# Patient Record
Sex: Female | Born: 1950
Health system: Southern US, Community
[De-identification: ages and names within clinical notes are randomized; demographics above are authoritative.]

## PROBLEM LIST (undated history)

## (undated) DIAGNOSIS — M1990 Unspecified osteoarthritis, unspecified site: Secondary | ICD-10-CM

## (undated) DIAGNOSIS — E079 Disorder of thyroid, unspecified: Secondary | ICD-10-CM

## (undated) DIAGNOSIS — E039 Hypothyroidism, unspecified: Secondary | ICD-10-CM

## (undated) DIAGNOSIS — E119 Type 2 diabetes mellitus without complications: Secondary | ICD-10-CM

## (undated) DIAGNOSIS — E7800 Pure hypercholesterolemia, unspecified: Secondary | ICD-10-CM

## (undated) DIAGNOSIS — I10 Essential (primary) hypertension: Secondary | ICD-10-CM

## (undated) DIAGNOSIS — C801 Malignant (primary) neoplasm, unspecified: Secondary | ICD-10-CM

## (undated) DIAGNOSIS — E785 Hyperlipidemia, unspecified: Secondary | ICD-10-CM

## (undated) DIAGNOSIS — R000 Tachycardia, unspecified: Secondary | ICD-10-CM

## (undated) DIAGNOSIS — R002 Palpitations: Secondary | ICD-10-CM

## (undated) DIAGNOSIS — I4510 Unspecified right bundle-branch block: Secondary | ICD-10-CM

## (undated) DIAGNOSIS — C50919 Malignant neoplasm of unspecified site of unspecified female breast: Secondary | ICD-10-CM

## (undated) DIAGNOSIS — T8859XA Other complications of anesthesia, initial encounter: Secondary | ICD-10-CM

## (undated) HISTORY — DX: Unspecified osteoarthritis, unspecified site: M19.90

## (undated) HISTORY — DX: Unspecified right bundle-branch block: I45.10

## (undated) HISTORY — PX: BREAST LUMPECTOMY: SHX2

## (undated) HISTORY — DX: Essential (primary) hypertension: I10

## (undated) HISTORY — PX: OTHER SURGICAL HISTORY: SHX169

## (undated) HISTORY — DX: Disorder of thyroid, unspecified: E07.9

## (undated) HISTORY — DX: Malignant (primary) neoplasm, unspecified: C80.1

## (undated) HISTORY — DX: Hyperlipidemia, unspecified: E78.5

## (undated) HISTORY — DX: Tachycardia, unspecified: R00.0

## (undated) HISTORY — DX: Palpitations: R00.2

---

## 1990-08-09 HISTORY — PX: BACK SURGERY: SHX140

## 1998-09-26 ENCOUNTER — Other Ambulatory Visit: Admission: RE | Admit: 1998-09-26 | Discharge: 1998-09-26 | Payer: Self-pay | Admitting: Family Medicine

## 1999-02-25 ENCOUNTER — Other Ambulatory Visit: Admission: RE | Admit: 1999-02-25 | Discharge: 1999-02-25 | Payer: Self-pay | Admitting: Urology

## 1999-02-26 ENCOUNTER — Other Ambulatory Visit: Admission: RE | Admit: 1999-02-26 | Discharge: 1999-02-26 | Payer: Self-pay | Admitting: Urology

## 1999-03-25 ENCOUNTER — Other Ambulatory Visit: Admission: RE | Admit: 1999-03-25 | Discharge: 1999-03-25 | Payer: Self-pay | Admitting: Urology

## 1999-04-01 ENCOUNTER — Other Ambulatory Visit: Admission: RE | Admit: 1999-04-01 | Discharge: 1999-04-01 | Payer: Self-pay | Admitting: Urology

## 2000-08-09 DIAGNOSIS — C801 Malignant (primary) neoplasm, unspecified: Secondary | ICD-10-CM

## 2000-08-09 DIAGNOSIS — C50919 Malignant neoplasm of unspecified site of unspecified female breast: Secondary | ICD-10-CM

## 2000-08-09 HISTORY — PX: BREAST LUMPECTOMY: SHX2

## 2000-08-09 HISTORY — DX: Malignant (primary) neoplasm, unspecified: C80.1

## 2000-08-09 HISTORY — DX: Malignant neoplasm of unspecified site of unspecified female breast: C50.919

## 2000-08-23 ENCOUNTER — Encounter: Admission: RE | Admit: 2000-08-23 | Discharge: 2000-08-23 | Payer: Self-pay | Admitting: Family Medicine

## 2000-08-23 ENCOUNTER — Encounter: Payer: Self-pay | Admitting: Family Medicine

## 2000-12-13 ENCOUNTER — Other Ambulatory Visit: Admission: RE | Admit: 2000-12-13 | Discharge: 2000-12-13 | Payer: Self-pay | Admitting: Radiology

## 2000-12-21 ENCOUNTER — Ambulatory Visit (HOSPITAL_BASED_OUTPATIENT_CLINIC_OR_DEPARTMENT_OTHER): Admission: RE | Admit: 2000-12-21 | Discharge: 2000-12-21 | Payer: Self-pay | Admitting: Surgery

## 2000-12-21 ENCOUNTER — Encounter (INDEPENDENT_AMBULATORY_CARE_PROVIDER_SITE_OTHER): Payer: Self-pay | Admitting: Specialist

## 2000-12-29 ENCOUNTER — Ambulatory Visit: Admission: RE | Admit: 2000-12-29 | Discharge: 2001-03-29 | Payer: Self-pay | Admitting: Surgery

## 2001-01-16 ENCOUNTER — Encounter: Payer: Self-pay | Admitting: *Deleted

## 2001-01-16 ENCOUNTER — Ambulatory Visit (HOSPITAL_COMMUNITY): Admission: RE | Admit: 2001-01-16 | Discharge: 2001-01-16 | Payer: Self-pay | Admitting: *Deleted

## 2001-01-19 ENCOUNTER — Ambulatory Visit (HOSPITAL_BASED_OUTPATIENT_CLINIC_OR_DEPARTMENT_OTHER): Admission: RE | Admit: 2001-01-19 | Discharge: 2001-01-19 | Payer: Self-pay | Admitting: Surgery

## 2001-01-19 ENCOUNTER — Encounter: Payer: Self-pay | Admitting: Surgery

## 2001-04-19 ENCOUNTER — Ambulatory Visit: Admission: RE | Admit: 2001-04-19 | Discharge: 2001-07-18 | Payer: Self-pay | Admitting: Radiation Oncology

## 2001-04-26 ENCOUNTER — Ambulatory Visit (HOSPITAL_BASED_OUTPATIENT_CLINIC_OR_DEPARTMENT_OTHER): Admission: RE | Admit: 2001-04-26 | Discharge: 2001-04-26 | Payer: Self-pay | Admitting: Surgery

## 2002-05-17 ENCOUNTER — Encounter: Admission: RE | Admit: 2002-05-17 | Discharge: 2002-08-15 | Payer: Self-pay

## 2002-12-25 ENCOUNTER — Encounter: Payer: Self-pay | Admitting: Family Medicine

## 2002-12-25 ENCOUNTER — Encounter: Admission: RE | Admit: 2002-12-25 | Discharge: 2002-12-25 | Payer: Self-pay | Admitting: Family Medicine

## 2003-03-11 ENCOUNTER — Ambulatory Visit (HOSPITAL_COMMUNITY): Admission: RE | Admit: 2003-03-11 | Discharge: 2003-03-11 | Payer: Self-pay | Admitting: Gastroenterology

## 2003-05-09 ENCOUNTER — Encounter: Admission: RE | Admit: 2003-05-09 | Discharge: 2003-05-09 | Payer: Self-pay | Admitting: Family Medicine

## 2003-05-09 ENCOUNTER — Encounter: Payer: Self-pay | Admitting: Family Medicine

## 2003-09-11 ENCOUNTER — Encounter: Admission: RE | Admit: 2003-09-11 | Discharge: 2003-09-11 | Payer: Self-pay | Admitting: Gastroenterology

## 2004-01-30 ENCOUNTER — Encounter: Admission: RE | Admit: 2004-01-30 | Discharge: 2004-01-30 | Payer: Self-pay | Admitting: Family Medicine

## 2004-04-03 ENCOUNTER — Ambulatory Visit (HOSPITAL_COMMUNITY): Admission: RE | Admit: 2004-04-03 | Discharge: 2004-04-03 | Payer: Self-pay | Admitting: Obstetrics and Gynecology

## 2004-04-03 ENCOUNTER — Encounter (INDEPENDENT_AMBULATORY_CARE_PROVIDER_SITE_OTHER): Payer: Self-pay | Admitting: *Deleted

## 2004-06-14 ENCOUNTER — Ambulatory Visit: Payer: Self-pay | Admitting: Oncology

## 2004-12-14 ENCOUNTER — Ambulatory Visit: Payer: Self-pay | Admitting: Oncology

## 2005-02-25 ENCOUNTER — Ambulatory Visit (HOSPITAL_COMMUNITY): Admission: RE | Admit: 2005-02-25 | Discharge: 2005-02-25 | Payer: Self-pay | Admitting: Gastroenterology

## 2005-02-25 ENCOUNTER — Encounter (INDEPENDENT_AMBULATORY_CARE_PROVIDER_SITE_OTHER): Payer: Self-pay | Admitting: *Deleted

## 2005-06-11 ENCOUNTER — Emergency Department (HOSPITAL_COMMUNITY): Admission: EM | Admit: 2005-06-11 | Discharge: 2005-06-11 | Payer: Self-pay | Admitting: Emergency Medicine

## 2005-06-14 ENCOUNTER — Ambulatory Visit: Payer: Self-pay | Admitting: Oncology

## 2006-03-15 ENCOUNTER — Ambulatory Visit: Payer: Self-pay | Admitting: Oncology

## 2006-06-10 ENCOUNTER — Ambulatory Visit: Payer: Self-pay | Admitting: Oncology

## 2006-08-06 ENCOUNTER — Ambulatory Visit: Payer: Self-pay | Admitting: Oncology

## 2006-08-11 LAB — FOLLICLE STIMULATING HORMONE: FSH: 22.1 m[IU]/mL

## 2006-08-19 LAB — ESTRADIOL, ULTRA SENS: Estradiol, Ultra Sensitive: 27 pg/mL

## 2006-10-05 ENCOUNTER — Encounter: Admission: RE | Admit: 2006-10-05 | Discharge: 2006-10-05 | Payer: Self-pay | Admitting: Family Medicine

## 2006-11-03 ENCOUNTER — Ambulatory Visit: Payer: Self-pay | Admitting: Oncology

## 2007-02-17 ENCOUNTER — Ambulatory Visit: Payer: Self-pay | Admitting: Oncology

## 2007-02-21 LAB — FOLLICLE STIMULATING HORMONE: FSH: 48.2 m[IU]/mL

## 2007-03-02 LAB — ESTRADIOL, ULTRA SENS: Estradiol, Ultra Sensitive: 6 pg/mL

## 2007-05-09 ENCOUNTER — Ambulatory Visit: Payer: Self-pay | Admitting: Oncology

## 2007-11-15 ENCOUNTER — Ambulatory Visit: Payer: Self-pay | Admitting: Oncology

## 2007-12-12 ENCOUNTER — Ambulatory Visit (HOSPITAL_COMMUNITY): Admission: RE | Admit: 2007-12-12 | Discharge: 2007-12-12 | Payer: Self-pay | Admitting: Cardiology

## 2008-08-15 ENCOUNTER — Ambulatory Visit: Payer: Self-pay | Admitting: Oncology

## 2009-05-15 ENCOUNTER — Ambulatory Visit: Payer: Self-pay | Admitting: Oncology

## 2010-05-15 ENCOUNTER — Ambulatory Visit: Payer: Self-pay | Admitting: Oncology

## 2010-07-24 ENCOUNTER — Ambulatory Visit: Payer: Self-pay | Admitting: Cardiology

## 2010-08-29 ENCOUNTER — Encounter: Payer: Self-pay | Admitting: Gastroenterology

## 2010-12-25 NOTE — Op Note (Signed)
NAME:  Christina Jensen, Christina Jensen NO.:  [id number]   MEDICAL RECORD NO.:  [id number]                   PATIENT TYPE:  AMB   LOCATION:  SDC                                  FACILITY:  [facility]   PHYSICIAN:  Laqueta Linden, M.D.                 DATE OF BIRTH:  05/04/1951   DATE OF PROCEDURE:  04/03/2004  DATE OF DISCHARGE:                                 OPERATIVE REPORT   PREOPERATIVE DIAGNOSES:  1. Large endometrial polyp.  2. Postmenopausal bleeding.  3. Breast cancer on tamoxifen.   POSTOPERATIVE DIAGNOSES:  1. Large endometrial polyp.  2. Postmenopausal bleeding.  3. Breast cancer on tamoxifen.   PROCEDURE:  Hysteroscopic resection with curettage.   SURGEON:  Laqueta Linden, M.D.   ANESTHESIA:  MAC sedation with paracervical block.   ESTIMATED BLOOD LOSS:  Less than 20 mL.   SORBITOL:  Less than 50 mL.   SPECIMENS:  1. Polyp.  2. Curettings.   COMPLICATIONS:  None.   INDICATIONS FOR PROCEDURE:  Christina Jensen is a 60 year old menopausal female  with a history of breast cancer status post lumpectomy, chemotherapy and  radiation who is now on tamoxifen therapy. She presented with postmenopausal  bleeding.  Evaluation included an office biopsy which was benign. She also  underwent ultrasound with sonohysterogram which was notable for a 2.3 x 2.1  cm fundal endometrial polyp most likely related to her tamoxifen use. She is  therefore to undergo outpatient resection of the polyp. She is therefore to  undergo outpatient resection of the polyp. She has seen the informed consent  film and voiced her understanding and acceptance of all risks, benefits,  alternatives and complications including infection, bleeding, uterine  perforation and possibility of recurrent polyps particularly due to her  tamoxifen use as well as other unnamed risks and she agrees to proceed. Full  consent has been given.   DESCRIPTION OF PROCEDURE:  The patient was taken to the  operating room and  after proper identification and consents were ascertained, she was placed on  the operating table in supine position. MAC sedation was accomplished and  she was placed in the [place] stirrups. Perineum and vagina were prepped and  draped in a routine sterile fashion and a Foley catheter was placed which  was removed at the conclusion of the procedure. Bimanual examination  confirmed a retroverted normal size uterus. A speculum was placed in the  vagina and the patient became quite agitated. She received some additional  IV anesthesia and eventually was able to tolerate the procedure. A  paracervical block utilizing 10 mL of 1% plain Xylocaine was then placed  circumferentially. The cervix was grasped with a single tooth tenaculum. The  internal os was gently dilated to a #31 Pratt dilator. The resectoscope was  then inserted under direct vision using continuous sorbitol infusion and  video. The endocervical canal was free of  lesions.  The endometrial cavity  was completely filled by a large polyp which appeared to have a broad fundal  posterior base. The right tubal ostia was seen. The left tubal ostia was not  visualized due to the bulk of the polyp. The resectoscope was placed on  routine settings and the polyp was then resected with multiple passes of the  loop with it being necessary to evacuate each piece individually such that  visualization of the remaining polyp could be obtained.  Eventually the  entire polyp was resected down to its base which actually appeared to be  more in the left cornual region.  The entire polyp appeared to be resected.  Thee was no active bleeding at the polyp base. The remainder of the  endometrial cavity appeared somewhat vascular and perhaps thickened. For  this reason, sharp curettage was performed with a minimal amount of  additional tissue obtained which was sent as a separate specimen. There was  no active bleeding from the  endometrial cavity. Photographs of pre and post  resection findings had been taken. The instruments were removed, there was a  slight amount of bleeding from the tenaculum site which responded to local  pressure. There was a slight amount of bleeding from the os but no active  bleeding noted.  Estimated blood loss was less than 20 mL, sorbitol net  intake less than 50 mL, complications none.   The patient was stable on transfer to the PACU. She received 30 mg of  Toradol IV, 30 mg IM intraoperatively.  She will be observed and discharged  per anesthesia protocol. She is to followup in the office in 2-3 weeks time  and to call sooner for excessive pain, fever, bleeding or other concerns.  She was given routine written and verbal discharge instructions. She is take  all her routine medications and to take Advil or Aleve as needed for  cramping.                                               Laqueta Linden, M.D.    LKS/MEDQ  D:  04/03/2004  T:  04/03/2004  Job:  [id number]   cc:   Christina Fendt, FNP   Leighton Roach. Truett Perna, M.D.  [address]  [place]  [place] [address]  Fax: [phone]

## 2011-06-07 ENCOUNTER — Encounter (HOSPITAL_BASED_OUTPATIENT_CLINIC_OR_DEPARTMENT_OTHER): Payer: BC Managed Care – PPO | Admitting: Oncology

## 2011-06-07 DIAGNOSIS — C50019 Malignant neoplasm of nipple and areola, unspecified female breast: Secondary | ICD-10-CM

## 2011-11-04 ENCOUNTER — Encounter: Payer: Self-pay | Admitting: *Deleted

## 2011-11-04 DIAGNOSIS — I4510 Unspecified right bundle-branch block: Secondary | ICD-10-CM | POA: Insufficient documentation

## 2011-11-04 DIAGNOSIS — R002 Palpitations: Secondary | ICD-10-CM | POA: Insufficient documentation

## 2011-11-04 DIAGNOSIS — C801 Malignant (primary) neoplasm, unspecified: Secondary | ICD-10-CM | POA: Insufficient documentation

## 2011-11-04 DIAGNOSIS — M1990 Unspecified osteoarthritis, unspecified site: Secondary | ICD-10-CM | POA: Insufficient documentation

## 2011-11-04 DIAGNOSIS — R000 Tachycardia, unspecified: Secondary | ICD-10-CM | POA: Insufficient documentation

## 2012-04-04 ENCOUNTER — Encounter: Payer: Self-pay | Admitting: *Deleted

## 2013-04-21 ENCOUNTER — Emergency Department (HOSPITAL_COMMUNITY): Payer: BC Managed Care – PPO

## 2013-04-21 ENCOUNTER — Emergency Department (HOSPITAL_COMMUNITY)
Admission: EM | Admit: 2013-04-21 | Discharge: 2013-04-21 | Disposition: A | Discharge status: Home / Self Care | Payer: BC Managed Care – PPO | Attending: Emergency Medicine | Admitting: Emergency Medicine

## 2013-04-21 ENCOUNTER — Encounter (HOSPITAL_COMMUNITY): Payer: Self-pay

## 2013-04-21 DIAGNOSIS — M1990 Unspecified osteoarthritis, unspecified site: Secondary | ICD-10-CM | POA: Insufficient documentation

## 2013-04-21 DIAGNOSIS — Z7982 Long term (current) use of aspirin: Secondary | ICD-10-CM | POA: Insufficient documentation

## 2013-04-21 DIAGNOSIS — R109 Unspecified abdominal pain: Secondary | ICD-10-CM

## 2013-04-21 DIAGNOSIS — Z853 Personal history of malignant neoplasm of breast: Secondary | ICD-10-CM | POA: Insufficient documentation

## 2013-04-21 DIAGNOSIS — E119 Type 2 diabetes mellitus without complications: Secondary | ICD-10-CM | POA: Insufficient documentation

## 2013-04-21 DIAGNOSIS — Z79899 Other long term (current) drug therapy: Secondary | ICD-10-CM | POA: Insufficient documentation

## 2013-04-21 DIAGNOSIS — R112 Nausea with vomiting, unspecified: Secondary | ICD-10-CM | POA: Insufficient documentation

## 2013-04-21 DIAGNOSIS — E039 Hypothyroidism, unspecified: Secondary | ICD-10-CM | POA: Insufficient documentation

## 2013-04-21 DIAGNOSIS — M549 Dorsalgia, unspecified: Secondary | ICD-10-CM | POA: Insufficient documentation

## 2013-04-21 DIAGNOSIS — R1011 Right upper quadrant pain: Secondary | ICD-10-CM | POA: Insufficient documentation

## 2013-04-21 DIAGNOSIS — J45909 Unspecified asthma, uncomplicated: Secondary | ICD-10-CM | POA: Insufficient documentation

## 2013-04-21 DIAGNOSIS — I10 Essential (primary) hypertension: Secondary | ICD-10-CM | POA: Insufficient documentation

## 2013-04-21 DIAGNOSIS — Z87891 Personal history of nicotine dependence: Secondary | ICD-10-CM | POA: Insufficient documentation

## 2013-04-21 LAB — CBC WITH DIFFERENTIAL/PLATELET
Basophils Absolute: 0.1 10*3/uL (ref 0.0–0.1)
Basophils Relative: 1 % (ref 0–1)
Eosinophils Absolute: 0.1 10*3/uL (ref 0.0–0.7)
Eosinophils Relative: 1 % (ref 0–5)
HCT: 40.4 % (ref 36.0–46.0)
Hemoglobin: 14.3 g/dL (ref 12.0–15.0)
Lymphocytes Relative: 18 % (ref 12–46)
Lymphs Abs: 1.5 10*3/uL (ref 0.7–4.0)
MCH: 32.2 pg (ref 26.0–34.0)
MCHC: 35.4 g/dL (ref 30.0–36.0)
MCV: 91 fL (ref 78.0–100.0)
Monocytes Absolute: 0.3 10*3/uL (ref 0.1–1.0)
Monocytes Relative: 4 % (ref 3–12)
Neutro Abs: 6.1 10*3/uL (ref 1.7–7.7)
Neutrophils Relative %: 76 % (ref 43–77)
Platelets: 298 10*3/uL (ref 150–400)
RBC: 4.44 MIL/uL (ref 3.87–5.11)
RDW: 12.5 % (ref 11.5–15.5)
WBC: 8.1 10*3/uL (ref 4.0–10.5)

## 2013-04-21 LAB — URINALYSIS, ROUTINE W REFLEX MICROSCOPIC
Bilirubin Urine: NEGATIVE
Glucose, UA: 100 mg/dL — AB
Ketones, ur: NEGATIVE mg/dL
Nitrite: NEGATIVE
Protein, ur: NEGATIVE mg/dL
Specific Gravity, Urine: 1.017 (ref 1.005–1.030)
Urobilinogen, UA: 0.2 mg/dL (ref 0.0–1.0)
pH: 7.5 (ref 5.0–8.0)

## 2013-04-21 LAB — HEPATIC FUNCTION PANEL
ALT: 46 U/L — ABNORMAL HIGH (ref 0–35)
AST: 42 U/L — ABNORMAL HIGH (ref 0–37)
Albumin: 4.4 g/dL (ref 3.5–5.2)
Alkaline Phosphatase: 61 U/L (ref 39–117)
Bilirubin, Direct: 0.1 mg/dL (ref 0.0–0.3)
Indirect Bilirubin: 0.4 mg/dL (ref 0.3–0.9)
Total Bilirubin: 0.5 mg/dL (ref 0.3–1.2)
Total Protein: 7.4 g/dL (ref 6.0–8.3)

## 2013-04-21 LAB — BASIC METABOLIC PANEL
BUN: 15 mg/dL (ref 6–23)
CO2: 25 mEq/L (ref 19–32)
Calcium: 10.5 mg/dL (ref 8.4–10.5)
Chloride: 99 mEq/L (ref 96–112)
Creatinine, Ser: 0.93 mg/dL (ref 0.50–1.10)
GFR calc Af Amer: 75 mL/min — ABNORMAL LOW (ref >90)
GFR calc non Af Amer: 65 mL/min — ABNORMAL LOW (ref >90)
Glucose, Bld: 185 mg/dL — ABNORMAL HIGH (ref 70–99)
Potassium: 3.2 mEq/L — ABNORMAL LOW (ref 3.5–5.1)
Sodium: 137 mEq/L (ref 135–145)

## 2013-04-21 LAB — URINE MICROSCOPIC-ADD ON

## 2013-04-21 LAB — LIPASE, BLOOD: Lipase: 45 U/L (ref 11–59)

## 2013-04-21 LAB — TROPONIN I: Troponin I: <0.3 ng/mL (ref <0.30)

## 2013-04-21 MED ORDER — IOHEXOL 300 MG/ML  SOLN
50.0000 mL | Freq: Once | INTRAMUSCULAR | Status: AC | PRN
  Administered 2013-04-21: 50 mL via ORAL

## 2013-04-21 MED ORDER — ONDANSETRON HCL 4 MG/2ML IJ SOLN
4.0000 mg | Freq: Once | INTRAMUSCULAR | Status: AC
  Administered 2013-04-21: 4 mg via INTRAVENOUS

## 2013-04-21 MED ORDER — IOHEXOL 300 MG/ML  SOLN
100.0000 mL | Freq: Once | INTRAMUSCULAR | Status: AC | PRN
  Administered 2013-04-21: 100 mL via INTRAVENOUS

## 2013-04-21 MED ORDER — PANTOPRAZOLE SODIUM 40 MG IV SOLR
40.0000 mg | Freq: Once | INTRAVENOUS | Status: AC
  Administered 2013-04-21: 40 mg via INTRAVENOUS
  Filled 2013-04-21: qty 40

## 2013-04-21 MED ORDER — MORPHINE SULFATE 4 MG/ML IJ SOLN
4.0000 mg | Freq: Once | INTRAMUSCULAR | Status: AC
  Administered 2013-04-21: 4 mg via INTRAVENOUS

## 2013-04-21 MED ORDER — ONDANSETRON 4 MG PO TBDP: ORAL_TABLET | ORAL | Status: DC

## 2013-04-21 MED ORDER — OXYCODONE-ACETAMINOPHEN 5-325 MG PO TABS: 1.0000 | ORAL_TABLET | ORAL | Status: DC | PRN

## 2013-04-21 MED ORDER — HYDROMORPHONE HCL PF 1 MG/ML IJ SOLN
1.0000 mg | Freq: Once | INTRAMUSCULAR | Status: AC
  Administered 2013-04-21: 1 mg via INTRAVENOUS
  Filled 2013-04-21: qty 1

## 2013-04-21 NOTE — ED Notes (Signed)
EKG given to EDP, Ranae Palms, MD.

## 2013-04-21 NOTE — ED Provider Notes (Signed)
Patient received in signout from Dr. Ranae Palms.  Patient had improvement of abdominal pain with Dilaudid. Reexamination reveals a soft abdomen without tenderness, rebound, or guarding. Patient was given a by mouth challenge. Patient will be discharged home with close followup.  Shon Baton, MD 04/21/13 [phone]

## 2013-04-21 NOTE — ED Notes (Signed)
Pt states abdominal pain x 1 week.  Pt states abdomen hurts to RLQ but has also had back pain.  Vomiting.  No diarrhea.  No fever.

## 2013-04-21 NOTE — ED Provider Notes (Signed)
CSN: [id number]     Arrival date & time 04/21/13  0321 History   First MD Initiated Contact with Patient 04/21/13 0404     Chief Complaint  Patient presents with  . Abdominal Pain   (Consider location/radiation/quality/duration/timing/severity/associated sxs/prior Treatment) HPI Patient presents with one week of right upper quadrant pain radiating to her right flank. Pain is steadily been getting worse. Patient states she had nausea and multiple episodes of vomiting tonight. Vomiting is nonbilious and nonbloody. She's had no diarrhea or constipation. She has no fevers or chills. Patient's meds in the past by gastroenterology and treated for gastrointestinal reflux disease. Patient still has her gallbladder and appendix. She denies dysuria or any change in her urinary frequency. Patient denies chest pain or shortness of breath. Patient has lower extremity swelling or pain. Past Medical History  Diagnosis Date  . Hyperlipidemia   . Hypertension   . Diabetes mellitus   . Cancer 2002    breast,lumpectomy  . RBBB     chronic  . Tachycardia     H/O  . Palpitations     chronic  . Asthma   . Thyroid disease     hypo  . Osteoarthritis    Past Surgical History  Procedure Laterality Date  . Childbirth      x1  . Back surgery  1992  . Cataracts      both eyes  . Breast lumpectomy Right 2002   Family History  Problem Relation Age of Onset  . Heart disease Mother    History  Substance Use Topics  . Smoking status: Former Smoker    Quit date: 08/09/1978  . Smokeless tobacco: Not on file  . Alcohol Use: Not on file   OB History   Grav Para Term Preterm Abortions TAB SAB Ect Mult Living                 Review of Systems  Constitutional: Negative for fever and chills.  HENT: Negative for neck pain.   Respiratory: Negative for cough and shortness of breath.   Cardiovascular: Negative for chest pain, palpitations and leg swelling.  Gastrointestinal: Positive for nausea, vomiting  and abdominal pain. Negative for diarrhea, constipation and blood in stool.  Genitourinary: Positive for flank pain. Negative for dysuria, frequency and hematuria.  Musculoskeletal: Positive for back pain. Negative for myalgias and arthralgias.  Skin: Negative for rash and wound.  Neurological: Negative for dizziness, weakness, light-headedness, numbness and headaches.  All other systems reviewed and are negative.    Allergies  Simvastatin  Home Medications   Current Outpatient Rx  Name  Route  Sig  Dispense  Refill  . ALPRAZolam (XANAX) 1 MG tablet   Oral   Take 0.5 tablets by mouth at bedtime as needed for sleep.          Marland Kitchen aspirin 81 MG tablet   Oral   Take 81 mg by mouth every evening.          . B Complex-C (SUPER B COMPLEX PO)   Oral   Take 1 tablet by mouth every evening.         . cholecalciferol (VITAMIN D) 1000 UNITS tablet   Oral   Take 1,000 Units by mouth every evening.          . fenofibrate 160 MG tablet   Oral   Take 1 tablet by mouth every evening.          Marland Kitchen levothyroxine (SYNTHROID, LEVOTHROID) 100 MCG  tablet   Oral   Take 100 mcg by mouth daily before breakfast.         . losartan (COZAAR) 100 MG tablet   Oral   Take 1 tablet by mouth every morning.         . metFORMIN (GLUCOPHAGE) 1000 MG tablet   Oral   Take 1,000 mg by mouth every evening.          . Multiple Minerals-Vitamins (CALCIUM-MAGNESIUM-ZINC-D3) TABS   Oral   Take 1 tablet by mouth 3 (three) times daily.          . Multiple Vitamin (MULTIVITAMIN) tablet   Oral   Take 1 tablet by mouth every morning.          . Omega-3 Fatty Acids (FISH OIL) 1000 MG CAPS   Oral   Take 1 capsule by mouth 3 (three) times daily.          Marland Kitchen venlafaxine XR (EFFEXOR-XR) 150 MG 24 hr capsule   Oral   Take 150 mg by mouth every evening.          BP 150/81  Pulse 77  Temp(Src) 98.1 F (36.7 C) (Oral)  Resp 20  SpO2 100% Physical Exam  Nursing note and vitals  reviewed. Constitutional: She is oriented to person, place, and time. She appears well-developed and well-nourished. She appears distressed.  Patient in moderate distress  HENT:  Head: Normocephalic and atraumatic.  Mouth/Throat: Oropharynx is clear and moist.  Eyes: EOM are normal. Pupils are equal, round, and reactive to light.  Neck: Normal range of motion. Neck supple.  Cardiovascular: Normal rate and regular rhythm.   Pulmonary/Chest: Effort normal and breath sounds normal. No respiratory distress. She has no wheezes. She has no rales.  Abdominal: Soft. Bowel sounds are normal. She exhibits no distension and no mass. There is tenderness (has mild tenderness to palpation of her epigastrium). There is no rebound and no guarding.  Musculoskeletal: Normal range of motion. She exhibits no edema and no tenderness.  No flank tenderness to palpation.  Neurological: She is alert and oriented to person, place, and time.  Patient is alert and oriented x3 with clear, goal oriented speech. Patient has 5/5 motor in all extremities. Sensation is intact to light touch. Patient has a normal gait and walks without assistance.   Skin: Skin is warm and dry. No rash noted. No erythema.  Psychiatric: She has a normal mood and affect. Her behavior is normal.    ED Course  Procedures (including critical care time) Labs Review Labs Reviewed  URINALYSIS, ROUTINE W REFLEX MICROSCOPIC - Abnormal; Notable for the following:    APPearance TURBID (*)    Glucose, UA 100 (*)    Hgb urine dipstick TRACE (*)    Leukocytes, UA TRACE (*)    All other components within normal limits  URINE MICROSCOPIC-ADD ON  CBC WITH DIFFERENTIAL  BASIC METABOLIC PANEL  LIPASE, BLOOD  HEPATIC FUNCTION PANEL  TROPONIN I   Imaging Review No results found.  Date: 04/21/2013  Rate: 74  Rhythm: normal sinus rhythm  QRS Axis: normal  Intervals: PR prolonged  ST/T Wave abnormalities: nonspecific T wave changes  Conduction  Disutrbances:right bundle branch block  Narrative Interpretation:   Old EKG Reviewed: none available  Patient with previous history of right bundle branch block. MDM   Patient momentarily improvement with IV pain medication but she states the pain has returned to her right upper quadrant. Ultrasound and CT were negative for  acute findings. Bowel sounds remained stable in the emergency department. Patient offered admission for pain control. States she would rather have that his pain medication possibly go home with oral pain medication. We'll give strict discharge instructions to return for worsening pain, fevers, persistent vomiting or any concerns. Oncoming ED to reassess and disposition.  Loren Racer, MD 04/21/13 [phone]

## 2013-05-01 ENCOUNTER — Other Ambulatory Visit: Payer: Self-pay | Admitting: Gastroenterology

## 2013-05-01 DIAGNOSIS — R1011 Right upper quadrant pain: Secondary | ICD-10-CM

## 2013-05-01 DIAGNOSIS — R112 Nausea with vomiting, unspecified: Secondary | ICD-10-CM

## 2013-05-01 DIAGNOSIS — R1013 Epigastric pain: Secondary | ICD-10-CM

## 2013-05-15 ENCOUNTER — Encounter (HOSPITAL_COMMUNITY)
Admission: RE | Admit: 2013-05-15 | Discharge: 2013-05-15 | Disposition: A | Discharge status: Home / Self Care | Payer: BC Managed Care – PPO | Source: Ambulatory Visit | Attending: Gastroenterology | Admitting: Gastroenterology

## 2013-05-15 DIAGNOSIS — R112 Nausea with vomiting, unspecified: Secondary | ICD-10-CM

## 2013-05-15 DIAGNOSIS — R1011 Right upper quadrant pain: Secondary | ICD-10-CM

## 2013-05-15 DIAGNOSIS — R1013 Epigastric pain: Secondary | ICD-10-CM

## 2013-05-15 MED ORDER — SINCALIDE 5 MCG IJ SOLR
0.0200 ug/kg | Freq: Once | INTRAMUSCULAR | Status: AC
  Administered 2013-05-15: 1.55 ug via INTRAVENOUS

## 2013-05-15 MED ORDER — TECHNETIUM TC 99M MEBROFENIN IV KIT
5.0000 | PACK | Freq: Once | INTRAVENOUS | Status: AC | PRN
  Administered 2013-05-15: 5 via INTRAVENOUS

## 2013-05-15 MED ORDER — SINCALIDE 5 MCG IJ SOLR
INTRAMUSCULAR | Status: AC
  Filled 2013-05-15: qty 5

## 2017-02-04 DIAGNOSIS — M65341 Trigger finger, right ring finger: Secondary | ICD-10-CM | POA: Diagnosis not present

## 2017-03-01 DIAGNOSIS — I10 Essential (primary) hypertension: Secondary | ICD-10-CM | POA: Diagnosis not present

## 2017-03-01 DIAGNOSIS — E119 Type 2 diabetes mellitus without complications: Secondary | ICD-10-CM | POA: Diagnosis not present

## 2017-03-01 DIAGNOSIS — E782 Mixed hyperlipidemia: Secondary | ICD-10-CM | POA: Diagnosis not present

## 2017-03-01 DIAGNOSIS — E039 Hypothyroidism, unspecified: Secondary | ICD-10-CM | POA: Diagnosis not present

## 2017-03-03 DIAGNOSIS — E119 Type 2 diabetes mellitus without complications: Secondary | ICD-10-CM | POA: Diagnosis not present

## 2017-03-03 DIAGNOSIS — Z23 Encounter for immunization: Secondary | ICD-10-CM | POA: Diagnosis not present

## 2017-03-03 DIAGNOSIS — E782 Mixed hyperlipidemia: Secondary | ICD-10-CM | POA: Diagnosis not present

## 2017-03-03 DIAGNOSIS — I10 Essential (primary) hypertension: Secondary | ICD-10-CM | POA: Diagnosis not present

## 2017-03-03 DIAGNOSIS — E039 Hypothyroidism, unspecified: Secondary | ICD-10-CM | POA: Diagnosis not present

## 2017-03-15 DIAGNOSIS — R928 Other abnormal and inconclusive findings on diagnostic imaging of breast: Secondary | ICD-10-CM | POA: Diagnosis not present

## 2017-03-15 DIAGNOSIS — Z853 Personal history of malignant neoplasm of breast: Secondary | ICD-10-CM | POA: Diagnosis not present

## 2017-04-04 DIAGNOSIS — E039 Hypothyroidism, unspecified: Secondary | ICD-10-CM | POA: Diagnosis not present

## 2017-04-04 DIAGNOSIS — I10 Essential (primary) hypertension: Secondary | ICD-10-CM | POA: Diagnosis not present

## 2017-04-13 DIAGNOSIS — M65341 Trigger finger, right ring finger: Secondary | ICD-10-CM | POA: Diagnosis not present

## 2017-04-21 DIAGNOSIS — M65341 Trigger finger, right ring finger: Secondary | ICD-10-CM | POA: Diagnosis not present

## 2017-04-27 DIAGNOSIS — M65341 Trigger finger, right ring finger: Secondary | ICD-10-CM | POA: Diagnosis not present

## 2017-08-15 DIAGNOSIS — E119 Type 2 diabetes mellitus without complications: Secondary | ICD-10-CM | POA: Diagnosis not present

## 2017-08-15 DIAGNOSIS — E039 Hypothyroidism, unspecified: Secondary | ICD-10-CM | POA: Diagnosis not present

## 2017-08-15 DIAGNOSIS — I10 Essential (primary) hypertension: Secondary | ICD-10-CM | POA: Diagnosis not present

## 2017-08-15 DIAGNOSIS — E782 Mixed hyperlipidemia: Secondary | ICD-10-CM | POA: Diagnosis not present

## 2017-08-17 DIAGNOSIS — E782 Mixed hyperlipidemia: Secondary | ICD-10-CM | POA: Diagnosis not present

## 2017-08-17 DIAGNOSIS — Z0000 Encounter for general adult medical examination without abnormal findings: Secondary | ICD-10-CM | POA: Diagnosis not present

## 2017-08-17 DIAGNOSIS — E119 Type 2 diabetes mellitus without complications: Secondary | ICD-10-CM | POA: Diagnosis not present

## 2017-08-17 DIAGNOSIS — E039 Hypothyroidism, unspecified: Secondary | ICD-10-CM | POA: Diagnosis not present

## 2017-08-17 DIAGNOSIS — I10 Essential (primary) hypertension: Secondary | ICD-10-CM | POA: Diagnosis not present

## 2017-08-18 DIAGNOSIS — Z780 Asymptomatic menopausal state: Secondary | ICD-10-CM | POA: Diagnosis not present

## 2017-09-15 DIAGNOSIS — R7989 Other specified abnormal findings of blood chemistry: Secondary | ICD-10-CM | POA: Diagnosis not present

## 2017-09-15 DIAGNOSIS — E039 Hypothyroidism, unspecified: Secondary | ICD-10-CM | POA: Diagnosis not present

## 2017-11-21 DIAGNOSIS — M79641 Pain in right hand: Secondary | ICD-10-CM | POA: Diagnosis not present

## 2017-11-21 DIAGNOSIS — M13841 Other specified arthritis, right hand: Secondary | ICD-10-CM | POA: Diagnosis not present

## 2017-12-19 DIAGNOSIS — E119 Type 2 diabetes mellitus without complications: Secondary | ICD-10-CM | POA: Diagnosis not present

## 2017-12-19 DIAGNOSIS — H524 Presbyopia: Secondary | ICD-10-CM | POA: Diagnosis not present

## 2017-12-19 DIAGNOSIS — H40013 Open angle with borderline findings, low risk, bilateral: Secondary | ICD-10-CM | POA: Diagnosis not present

## 2017-12-19 DIAGNOSIS — H52203 Unspecified astigmatism, bilateral: Secondary | ICD-10-CM | POA: Diagnosis not present

## 2017-12-19 DIAGNOSIS — Z961 Presence of intraocular lens: Secondary | ICD-10-CM | POA: Diagnosis not present

## 2018-01-28 DIAGNOSIS — M25462 Effusion, left knee: Secondary | ICD-10-CM | POA: Diagnosis not present

## 2018-01-28 DIAGNOSIS — M1712 Unilateral primary osteoarthritis, left knee: Secondary | ICD-10-CM | POA: Diagnosis not present

## 2018-01-28 DIAGNOSIS — M25562 Pain in left knee: Secondary | ICD-10-CM | POA: Diagnosis not present

## 2018-01-30 DIAGNOSIS — L570 Actinic keratosis: Secondary | ICD-10-CM | POA: Diagnosis not present

## 2018-01-30 DIAGNOSIS — L309 Dermatitis, unspecified: Secondary | ICD-10-CM | POA: Diagnosis not present

## 2018-02-10 DIAGNOSIS — E119 Type 2 diabetes mellitus without complications: Secondary | ICD-10-CM | POA: Diagnosis not present

## 2018-02-10 DIAGNOSIS — R7989 Other specified abnormal findings of blood chemistry: Secondary | ICD-10-CM | POA: Diagnosis not present

## 2018-02-10 DIAGNOSIS — I10 Essential (primary) hypertension: Secondary | ICD-10-CM | POA: Diagnosis not present

## 2018-02-10 DIAGNOSIS — E039 Hypothyroidism, unspecified: Secondary | ICD-10-CM | POA: Diagnosis not present

## 2018-02-10 DIAGNOSIS — E782 Mixed hyperlipidemia: Secondary | ICD-10-CM | POA: Diagnosis not present

## 2018-02-14 DIAGNOSIS — E1129 Type 2 diabetes mellitus with other diabetic kidney complication: Secondary | ICD-10-CM | POA: Diagnosis not present

## 2018-02-14 DIAGNOSIS — F3342 Major depressive disorder, recurrent, in full remission: Secondary | ICD-10-CM | POA: Diagnosis not present

## 2018-02-14 DIAGNOSIS — Z79899 Other long term (current) drug therapy: Secondary | ICD-10-CM | POA: Diagnosis not present

## 2018-02-14 DIAGNOSIS — I10 Essential (primary) hypertension: Secondary | ICD-10-CM | POA: Diagnosis not present

## 2018-02-14 DIAGNOSIS — Z23 Encounter for immunization: Secondary | ICD-10-CM | POA: Diagnosis not present

## 2018-02-14 DIAGNOSIS — R809 Proteinuria, unspecified: Secondary | ICD-10-CM | POA: Diagnosis not present

## 2018-02-14 DIAGNOSIS — Z0000 Encounter for general adult medical examination without abnormal findings: Secondary | ICD-10-CM | POA: Diagnosis not present

## 2018-02-14 DIAGNOSIS — E039 Hypothyroidism, unspecified: Secondary | ICD-10-CM | POA: Diagnosis not present

## 2018-02-14 DIAGNOSIS — D691 Qualitative platelet defects: Secondary | ICD-10-CM | POA: Diagnosis not present

## 2018-02-14 DIAGNOSIS — K591 Functional diarrhea: Secondary | ICD-10-CM | POA: Diagnosis not present

## 2018-03-23 DIAGNOSIS — Z1231 Encounter for screening mammogram for malignant neoplasm of breast: Secondary | ICD-10-CM | POA: Diagnosis not present

## 2018-03-23 DIAGNOSIS — Z853 Personal history of malignant neoplasm of breast: Secondary | ICD-10-CM | POA: Diagnosis not present

## 2018-03-30 DIAGNOSIS — M546 Pain in thoracic spine: Secondary | ICD-10-CM | POA: Diagnosis not present

## 2018-03-30 DIAGNOSIS — M9902 Segmental and somatic dysfunction of thoracic region: Secondary | ICD-10-CM | POA: Diagnosis not present

## 2018-03-30 DIAGNOSIS — M542 Cervicalgia: Secondary | ICD-10-CM | POA: Diagnosis not present

## 2018-03-30 DIAGNOSIS — M9901 Segmental and somatic dysfunction of cervical region: Secondary | ICD-10-CM | POA: Diagnosis not present

## 2018-04-03 DIAGNOSIS — M9902 Segmental and somatic dysfunction of thoracic region: Secondary | ICD-10-CM | POA: Diagnosis not present

## 2018-04-03 DIAGNOSIS — M542 Cervicalgia: Secondary | ICD-10-CM | POA: Diagnosis not present

## 2018-04-03 DIAGNOSIS — M546 Pain in thoracic spine: Secondary | ICD-10-CM | POA: Diagnosis not present

## 2018-04-03 DIAGNOSIS — M9901 Segmental and somatic dysfunction of cervical region: Secondary | ICD-10-CM | POA: Diagnosis not present

## 2018-04-05 DIAGNOSIS — M546 Pain in thoracic spine: Secondary | ICD-10-CM | POA: Diagnosis not present

## 2018-04-05 DIAGNOSIS — M9901 Segmental and somatic dysfunction of cervical region: Secondary | ICD-10-CM | POA: Diagnosis not present

## 2018-04-05 DIAGNOSIS — M9902 Segmental and somatic dysfunction of thoracic region: Secondary | ICD-10-CM | POA: Diagnosis not present

## 2018-04-05 DIAGNOSIS — M542 Cervicalgia: Secondary | ICD-10-CM | POA: Diagnosis not present

## 2018-04-06 DIAGNOSIS — M9901 Segmental and somatic dysfunction of cervical region: Secondary | ICD-10-CM | POA: Diagnosis not present

## 2018-04-06 DIAGNOSIS — M546 Pain in thoracic spine: Secondary | ICD-10-CM | POA: Diagnosis not present

## 2018-04-06 DIAGNOSIS — M542 Cervicalgia: Secondary | ICD-10-CM | POA: Diagnosis not present

## 2018-04-06 DIAGNOSIS — M9902 Segmental and somatic dysfunction of thoracic region: Secondary | ICD-10-CM | POA: Diagnosis not present

## 2018-04-11 DIAGNOSIS — M546 Pain in thoracic spine: Secondary | ICD-10-CM | POA: Diagnosis not present

## 2018-04-11 DIAGNOSIS — M9901 Segmental and somatic dysfunction of cervical region: Secondary | ICD-10-CM | POA: Diagnosis not present

## 2018-04-11 DIAGNOSIS — M542 Cervicalgia: Secondary | ICD-10-CM | POA: Diagnosis not present

## 2018-04-11 DIAGNOSIS — M9902 Segmental and somatic dysfunction of thoracic region: Secondary | ICD-10-CM | POA: Diagnosis not present

## 2018-04-12 DIAGNOSIS — M9901 Segmental and somatic dysfunction of cervical region: Secondary | ICD-10-CM | POA: Diagnosis not present

## 2018-04-12 DIAGNOSIS — M546 Pain in thoracic spine: Secondary | ICD-10-CM | POA: Diagnosis not present

## 2018-04-12 DIAGNOSIS — M9902 Segmental and somatic dysfunction of thoracic region: Secondary | ICD-10-CM | POA: Diagnosis not present

## 2018-04-12 DIAGNOSIS — M542 Cervicalgia: Secondary | ICD-10-CM | POA: Diagnosis not present

## 2018-04-13 DIAGNOSIS — M546 Pain in thoracic spine: Secondary | ICD-10-CM | POA: Diagnosis not present

## 2018-04-13 DIAGNOSIS — M542 Cervicalgia: Secondary | ICD-10-CM | POA: Diagnosis not present

## 2018-04-13 DIAGNOSIS — M9902 Segmental and somatic dysfunction of thoracic region: Secondary | ICD-10-CM | POA: Diagnosis not present

## 2018-04-13 DIAGNOSIS — M9901 Segmental and somatic dysfunction of cervical region: Secondary | ICD-10-CM | POA: Diagnosis not present

## 2018-04-18 DIAGNOSIS — M9902 Segmental and somatic dysfunction of thoracic region: Secondary | ICD-10-CM | POA: Diagnosis not present

## 2018-04-18 DIAGNOSIS — M542 Cervicalgia: Secondary | ICD-10-CM | POA: Diagnosis not present

## 2018-04-18 DIAGNOSIS — M546 Pain in thoracic spine: Secondary | ICD-10-CM | POA: Diagnosis not present

## 2018-04-18 DIAGNOSIS — M9901 Segmental and somatic dysfunction of cervical region: Secondary | ICD-10-CM | POA: Diagnosis not present

## 2018-04-20 DIAGNOSIS — M542 Cervicalgia: Secondary | ICD-10-CM | POA: Diagnosis not present

## 2018-04-20 DIAGNOSIS — M9901 Segmental and somatic dysfunction of cervical region: Secondary | ICD-10-CM | POA: Diagnosis not present

## 2018-04-20 DIAGNOSIS — M9902 Segmental and somatic dysfunction of thoracic region: Secondary | ICD-10-CM | POA: Diagnosis not present

## 2018-04-20 DIAGNOSIS — M546 Pain in thoracic spine: Secondary | ICD-10-CM | POA: Diagnosis not present

## 2018-04-25 DIAGNOSIS — M546 Pain in thoracic spine: Secondary | ICD-10-CM | POA: Diagnosis not present

## 2018-04-25 DIAGNOSIS — M9902 Segmental and somatic dysfunction of thoracic region: Secondary | ICD-10-CM | POA: Diagnosis not present

## 2018-04-25 DIAGNOSIS — M542 Cervicalgia: Secondary | ICD-10-CM | POA: Diagnosis not present

## 2018-04-25 DIAGNOSIS — M9901 Segmental and somatic dysfunction of cervical region: Secondary | ICD-10-CM | POA: Diagnosis not present

## 2018-04-27 DIAGNOSIS — M546 Pain in thoracic spine: Secondary | ICD-10-CM | POA: Diagnosis not present

## 2018-04-27 DIAGNOSIS — M9902 Segmental and somatic dysfunction of thoracic region: Secondary | ICD-10-CM | POA: Diagnosis not present

## 2018-04-27 DIAGNOSIS — M542 Cervicalgia: Secondary | ICD-10-CM | POA: Diagnosis not present

## 2018-04-27 DIAGNOSIS — M9901 Segmental and somatic dysfunction of cervical region: Secondary | ICD-10-CM | POA: Diagnosis not present

## 2018-05-01 DIAGNOSIS — M9901 Segmental and somatic dysfunction of cervical region: Secondary | ICD-10-CM | POA: Diagnosis not present

## 2018-05-01 DIAGNOSIS — M9902 Segmental and somatic dysfunction of thoracic region: Secondary | ICD-10-CM | POA: Diagnosis not present

## 2018-05-01 DIAGNOSIS — M546 Pain in thoracic spine: Secondary | ICD-10-CM | POA: Diagnosis not present

## 2018-05-01 DIAGNOSIS — M542 Cervicalgia: Secondary | ICD-10-CM | POA: Diagnosis not present

## 2018-05-09 DIAGNOSIS — M9902 Segmental and somatic dysfunction of thoracic region: Secondary | ICD-10-CM | POA: Diagnosis not present

## 2018-05-09 DIAGNOSIS — M546 Pain in thoracic spine: Secondary | ICD-10-CM | POA: Diagnosis not present

## 2018-05-09 DIAGNOSIS — M542 Cervicalgia: Secondary | ICD-10-CM | POA: Diagnosis not present

## 2018-05-09 DIAGNOSIS — M9901 Segmental and somatic dysfunction of cervical region: Secondary | ICD-10-CM | POA: Diagnosis not present

## 2018-05-23 DIAGNOSIS — M546 Pain in thoracic spine: Secondary | ICD-10-CM | POA: Diagnosis not present

## 2018-05-23 DIAGNOSIS — M9901 Segmental and somatic dysfunction of cervical region: Secondary | ICD-10-CM | POA: Diagnosis not present

## 2018-05-23 DIAGNOSIS — M542 Cervicalgia: Secondary | ICD-10-CM | POA: Diagnosis not present

## 2018-05-23 DIAGNOSIS — M9902 Segmental and somatic dysfunction of thoracic region: Secondary | ICD-10-CM | POA: Diagnosis not present

## 2018-05-30 DIAGNOSIS — M9902 Segmental and somatic dysfunction of thoracic region: Secondary | ICD-10-CM | POA: Diagnosis not present

## 2018-05-30 DIAGNOSIS — M542 Cervicalgia: Secondary | ICD-10-CM | POA: Diagnosis not present

## 2018-05-30 DIAGNOSIS — M9901 Segmental and somatic dysfunction of cervical region: Secondary | ICD-10-CM | POA: Diagnosis not present

## 2018-05-30 DIAGNOSIS — M546 Pain in thoracic spine: Secondary | ICD-10-CM | POA: Diagnosis not present

## 2018-06-20 DIAGNOSIS — M546 Pain in thoracic spine: Secondary | ICD-10-CM | POA: Diagnosis not present

## 2018-06-20 DIAGNOSIS — M9902 Segmental and somatic dysfunction of thoracic region: Secondary | ICD-10-CM | POA: Diagnosis not present

## 2018-06-20 DIAGNOSIS — M542 Cervicalgia: Secondary | ICD-10-CM | POA: Diagnosis not present

## 2018-06-20 DIAGNOSIS — M9901 Segmental and somatic dysfunction of cervical region: Secondary | ICD-10-CM | POA: Diagnosis not present

## 2018-07-18 DIAGNOSIS — M546 Pain in thoracic spine: Secondary | ICD-10-CM | POA: Diagnosis not present

## 2018-07-18 DIAGNOSIS — M9901 Segmental and somatic dysfunction of cervical region: Secondary | ICD-10-CM | POA: Diagnosis not present

## 2018-07-18 DIAGNOSIS — M9902 Segmental and somatic dysfunction of thoracic region: Secondary | ICD-10-CM | POA: Diagnosis not present

## 2018-07-18 DIAGNOSIS — M542 Cervicalgia: Secondary | ICD-10-CM | POA: Diagnosis not present

## 2018-07-20 DIAGNOSIS — M9901 Segmental and somatic dysfunction of cervical region: Secondary | ICD-10-CM | POA: Diagnosis not present

## 2018-07-20 DIAGNOSIS — M546 Pain in thoracic spine: Secondary | ICD-10-CM | POA: Diagnosis not present

## 2018-07-20 DIAGNOSIS — M9902 Segmental and somatic dysfunction of thoracic region: Secondary | ICD-10-CM | POA: Diagnosis not present

## 2018-07-20 DIAGNOSIS — M542 Cervicalgia: Secondary | ICD-10-CM | POA: Diagnosis not present

## 2018-07-24 DIAGNOSIS — M9901 Segmental and somatic dysfunction of cervical region: Secondary | ICD-10-CM | POA: Diagnosis not present

## 2018-07-24 DIAGNOSIS — M542 Cervicalgia: Secondary | ICD-10-CM | POA: Diagnosis not present

## 2018-07-24 DIAGNOSIS — M9902 Segmental and somatic dysfunction of thoracic region: Secondary | ICD-10-CM | POA: Diagnosis not present

## 2018-07-24 DIAGNOSIS — M546 Pain in thoracic spine: Secondary | ICD-10-CM | POA: Diagnosis not present

## 2018-07-27 DIAGNOSIS — M9901 Segmental and somatic dysfunction of cervical region: Secondary | ICD-10-CM | POA: Diagnosis not present

## 2018-07-27 DIAGNOSIS — M542 Cervicalgia: Secondary | ICD-10-CM | POA: Diagnosis not present

## 2018-07-27 DIAGNOSIS — M9902 Segmental and somatic dysfunction of thoracic region: Secondary | ICD-10-CM | POA: Diagnosis not present

## 2018-07-27 DIAGNOSIS — M546 Pain in thoracic spine: Secondary | ICD-10-CM | POA: Diagnosis not present

## 2018-08-15 DIAGNOSIS — L820 Inflamed seborrheic keratosis: Secondary | ICD-10-CM | POA: Diagnosis not present

## 2018-08-15 DIAGNOSIS — L245 Irritant contact dermatitis due to other chemical products: Secondary | ICD-10-CM | POA: Diagnosis not present

## 2018-08-24 DIAGNOSIS — M9902 Segmental and somatic dysfunction of thoracic region: Secondary | ICD-10-CM | POA: Diagnosis not present

## 2018-08-24 DIAGNOSIS — M546 Pain in thoracic spine: Secondary | ICD-10-CM | POA: Diagnosis not present

## 2018-08-24 DIAGNOSIS — M9901 Segmental and somatic dysfunction of cervical region: Secondary | ICD-10-CM | POA: Diagnosis not present

## 2018-08-24 DIAGNOSIS — M542 Cervicalgia: Secondary | ICD-10-CM | POA: Diagnosis not present

## 2018-08-29 DIAGNOSIS — I10 Essential (primary) hypertension: Secondary | ICD-10-CM | POA: Diagnosis not present

## 2018-08-29 DIAGNOSIS — E782 Mixed hyperlipidemia: Secondary | ICD-10-CM | POA: Diagnosis not present

## 2018-08-29 DIAGNOSIS — E039 Hypothyroidism, unspecified: Secondary | ICD-10-CM | POA: Diagnosis not present

## 2018-08-29 DIAGNOSIS — F411 Generalized anxiety disorder: Secondary | ICD-10-CM | POA: Diagnosis not present

## 2018-08-29 DIAGNOSIS — K591 Functional diarrhea: Secondary | ICD-10-CM | POA: Diagnosis not present

## 2018-08-29 DIAGNOSIS — J22 Unspecified acute lower respiratory infection: Secondary | ICD-10-CM | POA: Diagnosis not present

## 2018-08-29 DIAGNOSIS — E119 Type 2 diabetes mellitus without complications: Secondary | ICD-10-CM | POA: Diagnosis not present

## 2018-08-29 DIAGNOSIS — F3342 Major depressive disorder, recurrent, in full remission: Secondary | ICD-10-CM | POA: Diagnosis not present

## 2018-08-29 DIAGNOSIS — D691 Qualitative platelet defects: Secondary | ICD-10-CM | POA: Diagnosis not present

## 2018-11-17 ENCOUNTER — Emergency Department (HOSPITAL_COMMUNITY)
Admission: EM | Admit: 2018-11-17 | Discharge: 2018-11-17 | Disposition: A | Discharge status: Home / Self Care | Payer: PPO | Attending: Emergency Medicine | Admitting: Emergency Medicine

## 2018-11-17 ENCOUNTER — Ambulatory Visit (HOSPITAL_COMMUNITY)
Admission: EM | Admit: 2018-11-17 | Discharge: 2018-11-17 | Disposition: A | Discharge status: Home / Self Care | Payer: Self-pay

## 2018-11-17 ENCOUNTER — Emergency Department (HOSPITAL_COMMUNITY): Payer: PPO

## 2018-11-17 ENCOUNTER — Other Ambulatory Visit: Payer: Self-pay

## 2018-11-17 ENCOUNTER — Encounter (HOSPITAL_COMMUNITY): Payer: Self-pay | Admitting: Emergency Medicine

## 2018-11-17 DIAGNOSIS — M79642 Pain in left hand: Secondary | ICD-10-CM | POA: Insufficient documentation

## 2018-11-17 DIAGNOSIS — M79641 Pain in right hand: Secondary | ICD-10-CM | POA: Diagnosis not present

## 2018-11-17 DIAGNOSIS — Z1329 Encounter for screening for other suspected endocrine disorder: Secondary | ICD-10-CM | POA: Diagnosis not present

## 2018-11-17 DIAGNOSIS — R200 Anesthesia of skin: Secondary | ICD-10-CM | POA: Insufficient documentation

## 2018-11-17 DIAGNOSIS — S0083XA Contusion of other part of head, initial encounter: Secondary | ICD-10-CM | POA: Diagnosis not present

## 2018-11-17 DIAGNOSIS — E042 Nontoxic multinodular goiter: Secondary | ICD-10-CM | POA: Diagnosis not present

## 2018-11-17 DIAGNOSIS — Y929 Unspecified place or not applicable: Secondary | ICD-10-CM | POA: Insufficient documentation

## 2018-11-17 DIAGNOSIS — E039 Hypothyroidism, unspecified: Secondary | ICD-10-CM | POA: Insufficient documentation

## 2018-11-17 DIAGNOSIS — Z13220 Encounter for screening for lipoid disorders: Secondary | ICD-10-CM | POA: Diagnosis not present

## 2018-11-17 DIAGNOSIS — IMO0001 Reserved for inherently not codable concepts without codable children: Secondary | ICD-10-CM

## 2018-11-17 DIAGNOSIS — Z853 Personal history of malignant neoplasm of breast: Secondary | ICD-10-CM | POA: Diagnosis not present

## 2018-11-17 DIAGNOSIS — Z87891 Personal history of nicotine dependence: Secondary | ICD-10-CM | POA: Insufficient documentation

## 2018-11-17 DIAGNOSIS — Y9301 Activity, walking, marching and hiking: Secondary | ICD-10-CM | POA: Diagnosis not present

## 2018-11-17 DIAGNOSIS — M4802 Spinal stenosis, cervical region: Secondary | ICD-10-CM | POA: Diagnosis not present

## 2018-11-17 DIAGNOSIS — W1830XA Fall on same level, unspecified, initial encounter: Secondary | ICD-10-CM | POA: Diagnosis not present

## 2018-11-17 DIAGNOSIS — Z131 Encounter for screening for diabetes mellitus: Secondary | ICD-10-CM | POA: Diagnosis not present

## 2018-11-17 DIAGNOSIS — F0390 Unspecified dementia without behavioral disturbance: Secondary | ICD-10-CM | POA: Diagnosis not present

## 2018-11-17 DIAGNOSIS — E119 Type 2 diabetes mellitus without complications: Secondary | ICD-10-CM | POA: Insufficient documentation

## 2018-11-17 DIAGNOSIS — I10 Essential (primary) hypertension: Secondary | ICD-10-CM | POA: Diagnosis not present

## 2018-11-17 DIAGNOSIS — Z23 Encounter for immunization: Secondary | ICD-10-CM | POA: Diagnosis not present

## 2018-11-17 DIAGNOSIS — M542 Cervicalgia: Secondary | ICD-10-CM | POA: Diagnosis not present

## 2018-11-17 DIAGNOSIS — L570 Actinic keratosis: Secondary | ICD-10-CM | POA: Diagnosis not present

## 2018-11-17 DIAGNOSIS — R202 Paresthesia of skin: Secondary | ICD-10-CM | POA: Diagnosis not present

## 2018-11-17 DIAGNOSIS — R350 Frequency of micturition: Secondary | ICD-10-CM | POA: Diagnosis not present

## 2018-11-17 DIAGNOSIS — Z794 Long term (current) use of insulin: Secondary | ICD-10-CM | POA: Insufficient documentation

## 2018-11-17 DIAGNOSIS — Y999 Unspecified external cause status: Secondary | ICD-10-CM | POA: Diagnosis not present

## 2018-11-17 DIAGNOSIS — S0990XA Unspecified injury of head, initial encounter: Secondary | ICD-10-CM | POA: Insufficient documentation

## 2018-11-17 DIAGNOSIS — T07XXXA Unspecified multiple injuries, initial encounter: Secondary | ICD-10-CM | POA: Diagnosis present

## 2018-11-17 DIAGNOSIS — R209 Unspecified disturbances of skin sensation: Secondary | ICD-10-CM

## 2018-11-17 DIAGNOSIS — Z125 Encounter for screening for malignant neoplasm of prostate: Secondary | ICD-10-CM | POA: Diagnosis not present

## 2018-11-17 DIAGNOSIS — X32XXXA Exposure to sunlight, initial encounter: Secondary | ICD-10-CM | POA: Diagnosis not present

## 2018-11-17 DIAGNOSIS — W19XXXA Unspecified fall, initial encounter: Secondary | ICD-10-CM

## 2018-11-17 HISTORY — DX: Malignant neoplasm of unspecified site of unspecified female breast: C50.919

## 2018-11-17 HISTORY — DX: Type 2 diabetes mellitus without complications: E11.9

## 2018-11-17 HISTORY — DX: Pure hypercholesterolemia, unspecified: E78.00

## 2018-11-17 HISTORY — DX: Hypothyroidism, unspecified: E03.9

## 2018-11-17 MED ORDER — FENTANYL CITRATE (PF) 100 MCG/2ML IJ SOLN
50.0000 ug | Freq: Once | INTRAMUSCULAR | Status: AC
  Administered 2018-11-17: 50 ug via INTRAVENOUS
  Filled 2018-11-17: qty 2

## 2018-11-17 MED ORDER — MORPHINE SULFATE (PF) 4 MG/ML IV SOLN
4.0000 mg | Freq: Once | INTRAVENOUS | Status: AC
  Administered 2018-11-17: 4 mg via INTRAVENOUS
  Filled 2018-11-17: qty 1

## 2018-11-17 NOTE — ED Notes (Signed)
Patient transported to MRI 

## 2018-11-17 NOTE — ED Notes (Signed)
Two attempts to PIV and both veins blew.

## 2018-11-17 NOTE — ED Notes (Signed)
C-collar placed on pt.

## 2018-11-17 NOTE — ED Triage Notes (Signed)
Pt from UC, pt tripped on a concrete paver and landed on her face and right shoulder. Reports shooting pains to both hands. Abrasions noted to face.

## 2018-11-17 NOTE — ED Notes (Signed)
ED Provider at bedside. 

## 2018-11-17 NOTE — Discharge Instructions (Signed)
You were seen in the Emergency Department (ED) today for a head injury.  Based on your evaluation, you may have sustained a concussion (or bruise) to your brain.  If you had a CT scan done, it did not show any evidence of serious injury or bleeding.    Symptoms to expect from a concussion include nausea, mild to moderate headache, difficulty concentrating or sleeping, and mild lightheadedness.  These symptoms should improve over the next few days to weeks, but it may take many weeks before you feel back to normal.  Return to the emergency department or follow-up with your primary care doctor if your symptoms are not improving over this time.  Signs of a more serious head injury include vomiting, severe headache, excessive sleepiness or confusion, and weakness or numbness in your face, arms or legs.  Return immediately to the Emergency Department if you experience any of these more concerning symptoms.    Rest, avoid strenuous physical or mental activity, and avoid activities that could potentially result in another head injury until all your symptoms from this head injury are completely resolved for at least 2-3 weeks.  You may take ibuprofen or acetaminophen over the counter according to label instructions for mild headache or scalp soreness.  

## 2018-11-17 NOTE — ED Notes (Signed)
wheeled to ER by [employer]

## 2018-11-17 NOTE — ED Provider Notes (Signed)
Emergency Department Provider Note   I have reviewed the triage vital signs and the nursing notes.   HISTORY  Chief Complaint Fall and Neck Pain   HPI Christina Jensen is a 68 y.o. female with PMH of IDDM, HLD, and hypothyroidism resents to the emergency department for evaluation after mechanical fall.  Patient states that she was walking and tripped over some uneven sidewalk and fell forward.  She does not believe she had time to put her hands out to catch herself.  She did strike her head but denies loss of consciousness.  She did not feel presyncopal prior to fall.  She denies being on blood thinners.  Patient describes having tingling/electric type pain in her bilateral hands.  She denies neck pain.  Denies any pain in the arms, chest, abdomen, legs.     Past Medical History:  Diagnosis Date   Breast cancer ([place]) 2002   Diabetes mellitus without complication ([place])    type 1   High cholesterol    Hypothyroid     There are no active problems to display for this patient.   Past Surgical History:  Procedure Laterality Date   BREAST LUMPECTOMY     2002    Allergies Patient has no allergy information on record.  History reviewed. No pertinent family history.  Social History Social History   Tobacco Use   Smoking status: Former Smoker    Last attempt to quit: 1980    Years since quitting: 40.3   Smokeless tobacco: Never Used  Substance Use Topics   Alcohol use: Yes    Comment: occasionally wine   Drug use: Never    Review of Systems  Constitutional: No fever/chills Eyes: No visual changes. ENT: No sore throat. Cardiovascular: Denies chest pain. Respiratory: Denies shortness of breath. Gastrointestinal: No abdominal pain.  No nausea, no vomiting.  No diarrhea.  No constipation. Genitourinary: Negative for dysuria. Musculoskeletal: Positive bilateral hand pain.  Skin: Abrasions to face.  Neurological: Negative for focal weakness or numbness.  Positive HA.   10-point ROS otherwise negative.  ____________________________________________   PHYSICAL EXAM:  VITAL SIGNS: ED Triage Vitals  Enc Vitals Group     BP 11/17/18 1621 135/88     Pulse Rate 11/17/18 1621 82     Resp 11/17/18 1621 16     Temp 11/17/18 1621 (!) 97.5 F (36.4 C)     Temp Source 11/17/18 1621 Oral     SpO2 11/17/18 1621 96 %     Pain Score 11/17/18 1617 7   Constitutional: Alert and oriented. Well appearing and in no acute distress. Eyes: Conjunctivae are normal. PERRL. EOMI. Head: Forehead abrasion and abrasion with hematoma over the chin.  Nose: No congestion/rhinnorhea. Mouth/Throat: Mucous membranes are moist.  Oropharynx non-erythematous. Neck: No stridor. C-collar in place.  Cardiovascular: Normal rate, regular rhythm. Good peripheral circulation. Grossly normal heart sounds.   Respiratory: Normal respiratory effort.  No retractions. Lungs CTAB. Gastrointestinal: Soft and nontender. No distention.  Musculoskeletal: No lower extremity tenderness nor edema. No gross deformities of extremities. No bony tenderness over the hands/wrists. No scaphoid tenderness.  Neurologic:  Normal speech and language. No gross focal neurologic deficits are appreciated. Normal biceps and triceps strength.  Skin:  Skin is warm and dry. Abrasions to face/chin.   ____________________________________________  RADIOLOGY  Ct Head Wo Contrast  Result Date: 11/17/2018 CLINICAL DATA:  Trip and fall landing on face. Head trauma, ataxia fell on face; Neck pain, initial exam tingling to  hands, fall; Head trauma, ataxia EXAM: CT HEAD WITHOUT CONTRAST CT MAXILLOFACIAL WITHOUT CONTRAST CT CERVICAL SPINE WITHOUT CONTRAST TECHNIQUE: Multidetector CT imaging of the head, cervical spine, and maxillofacial structures were performed using the standard protocol without intravenous contrast. Multiplanar CT image reconstructions of the cervical spine and maxillofacial structures were also  generated. COMPARISON:  None. FINDINGS: CT HEAD FINDINGS Brain: No intracranial hemorrhage, mass effect, or midline shift. No hydrocephalus. The basilar cisterns are patent. No evidence of territorial infarct or acute ischemia. No extra-axial or intracranial fluid collection. Vascular: No hyperdense vessel. Skull: No fracture or focal lesion. Other: None. CT MAXILLOFACIAL FINDINGS Osseous: Zygomatic arches, mandibles, and nasal bone are intact. Temporomandibular joints are congruent. Orbits: No orbital fracture. Both orbits and globes are intact. Bilateral lens resection. Sinuses: Clear. No sinus fracture or fluid level. Soft tissues: Scattered soft tissue edema overlying the right trauma and supraorbital soft tissues. No radiopaque foreign body. CT CERVICAL SPINE FINDINGS Alignment: Trace anterolisthesis C4 on C5 is likely degenerative and facet mediated. Trace anterolisthesis of C7 on T1 also likely degenerative. No traumatic subluxation. Facets are normally aligned. Skull base and vertebrae: No acute fracture. Vertebral body heights are maintained. The dens and skull base are intact. Soft tissues and spinal canal: No prevertebral fluid or swelling. No visible canal hematoma. Disc levels: Multilevel disc space narrowing, endplate spurring, and endplate irregularity throughout the entire cervical spine. Findings are most prominent at C5-C6 and C6-C7. Multilevel facet arthropathy. No evidence of bony canal stenosis. There is scattered neural foraminal stenosis. Upper chest: No acute finding. Other: None. IMPRESSION: 1. No acute intracranial abnormality. No skull fracture. 2. No facial bone fracture. Scattered soft tissue edema about the face. 3. Multilevel degenerative change throughout the cervical spine without acute fracture or subluxation. Electronically Signed   By: Keith Rake M.D.   On: 11/17/2018 19:11   Ct Cervical Spine Wo Contrast  Result Date: 11/17/2018 CLINICAL DATA:  Trip and fall landing on  face. Head trauma, ataxia fell on face; Neck pain, initial exam tingling to hands, fall; Head trauma, ataxia EXAM: CT HEAD WITHOUT CONTRAST CT MAXILLOFACIAL WITHOUT CONTRAST CT CERVICAL SPINE WITHOUT CONTRAST TECHNIQUE: Multidetector CT imaging of the head, cervical spine, and maxillofacial structures were performed using the standard protocol without intravenous contrast. Multiplanar CT image reconstructions of the cervical spine and maxillofacial structures were also generated. COMPARISON:  None. FINDINGS: CT HEAD FINDINGS Brain: No intracranial hemorrhage, mass effect, or midline shift. No hydrocephalus. The basilar cisterns are patent. No evidence of territorial infarct or acute ischemia. No extra-axial or intracranial fluid collection. Vascular: No hyperdense vessel. Skull: No fracture or focal lesion. Other: None. CT MAXILLOFACIAL FINDINGS Osseous: Zygomatic arches, mandibles, and nasal bone are intact. Temporomandibular joints are congruent. Orbits: No orbital fracture. Both orbits and globes are intact. Bilateral lens resection. Sinuses: Clear. No sinus fracture or fluid level. Soft tissues: Scattered soft tissue edema overlying the right trauma and supraorbital soft tissues. No radiopaque foreign body. CT CERVICAL SPINE FINDINGS Alignment: Trace anterolisthesis C4 on C5 is likely degenerative and facet mediated. Trace anterolisthesis of C7 on T1 also likely degenerative. No traumatic subluxation. Facets are normally aligned. Skull base and vertebrae: No acute fracture. Vertebral body heights are maintained. The dens and skull base are intact. Soft tissues and spinal canal: No prevertebral fluid or swelling. No visible canal hematoma. Disc levels: Multilevel disc space narrowing, endplate spurring, and endplate irregularity throughout the entire cervical spine. Findings are most prominent at C5-C6 and C6-C7. Multilevel facet  arthropathy. No evidence of bony canal stenosis. There is scattered neural foraminal  stenosis. Upper chest: No acute finding. Other: None. IMPRESSION: 1. No acute intracranial abnormality. No skull fracture. 2. No facial bone fracture. Scattered soft tissue edema about the face. 3. Multilevel degenerative change throughout the cervical spine without acute fracture or subluxation. Electronically Signed   By: Keith Rake M.D.   On: 11/17/2018 19:11   Mr Cervical Spine Wo Contrast  Result Date: 11/17/2018 CLINICAL DATA:  Cervical spine trauma EXAM: MRI CERVICAL SPINE WITHOUT CONTRAST TECHNIQUE: Multiplanar, multisequence MR imaging of the cervical spine was performed. No intravenous contrast was administered. COMPARISON:  CT cervical spine 11/17/2018 FINDINGS: Alignment: Normal. Vertebrae: There are degenerative endplate signal changes at C5-6 and T1-2. No evidence of ligamentous injury. No acute fracture. Cord: Normal caliber and signal. Posterior Fossa, vertebral arteries, paraspinal tissues: Visualized posterior fossa is normal. Vertebral artery flow voids are preserved. No prevertebral effusion. Disc levels: Sagittal imaging includes the atlantoaxial joint to the level of the T1-2 disc space, with axial imaging of the disc spaces from C2-3 to C7-T1. C1-2: Normal. C2-3: Moderate right facet hypertrophy. Minimal disc bulge. Moderate right neural foraminal stenosis. C3-4: Mild bilateral uncovertebral hypertrophy with moderate right and mild left foraminal narrowing. C4-5: Moderate left facet hypertrophy. Left-greater-than-right uncovertebral hypertrophy. Severe left neural foraminal stenosis. C5-6: Intermediate disc osteophyte complex with mild spinal canal stenosis. Moderate bilateral neural foraminal stenosis. C6-7: Small disc osteophyte complex with mild bilateral neural foraminal stenosis. C7-T1: Minimal disc bulge without stenosis. T1-2: Mild disc bulge without spinal canal stenosis. IMPRESSION: 1. No acute ligamentous injury of the cervical spine. Normal appearance of the spinal cord.  2. Mild C5-6 spinal canal stenosis and moderate bilateral neural foraminal stenosis. 3. Severe left C4-5 neural foraminal stenosis. 4. Moderate right C2-3 and C3-4 neural foraminal stenosis. Electronically Signed   By: Ulyses Jarred M.D.   On: 11/17/2018 21:07   Ct Maxillofacial Wo Contrast  Result Date: 11/17/2018 CLINICAL DATA:  Trip and fall landing on face. Head trauma, ataxia fell on face; Neck pain, initial exam tingling to hands, fall; Head trauma, ataxia EXAM: CT HEAD WITHOUT CONTRAST CT MAXILLOFACIAL WITHOUT CONTRAST CT CERVICAL SPINE WITHOUT CONTRAST TECHNIQUE: Multidetector CT imaging of the head, cervical spine, and maxillofacial structures were performed using the standard protocol without intravenous contrast. Multiplanar CT image reconstructions of the cervical spine and maxillofacial structures were also generated. COMPARISON:  None. FINDINGS: CT HEAD FINDINGS Brain: No intracranial hemorrhage, mass effect, or midline shift. No hydrocephalus. The basilar cisterns are patent. No evidence of territorial infarct or acute ischemia. No extra-axial or intracranial fluid collection. Vascular: No hyperdense vessel. Skull: No fracture or focal lesion. Other: None. CT MAXILLOFACIAL FINDINGS Osseous: Zygomatic arches, mandibles, and nasal bone are intact. Temporomandibular joints are congruent. Orbits: No orbital fracture. Both orbits and globes are intact. Bilateral lens resection. Sinuses: Clear. No sinus fracture or fluid level. Soft tissues: Scattered soft tissue edema overlying the right trauma and supraorbital soft tissues. No radiopaque foreign body. CT CERVICAL SPINE FINDINGS Alignment: Trace anterolisthesis C4 on C5 is likely degenerative and facet mediated. Trace anterolisthesis of C7 on T1 also likely degenerative. No traumatic subluxation. Facets are normally aligned. Skull base and vertebrae: No acute fracture. Vertebral body heights are maintained. The dens and skull base are intact. Soft  tissues and spinal canal: No prevertebral fluid or swelling. No visible canal hematoma. Disc levels: Multilevel disc space narrowing, endplate spurring, and endplate irregularity throughout the entire cervical spine. Findings  are most prominent at C5-C6 and C6-C7. Multilevel facet arthropathy. No evidence of bony canal stenosis. There is scattered neural foraminal stenosis. Upper chest: No acute finding. Other: None. IMPRESSION: 1. No acute intracranial abnormality. No skull fracture. 2. No facial bone fracture. Scattered soft tissue edema about the face. 3. Multilevel degenerative change throughout the cervical spine without acute fracture or subluxation. Electronically Signed   By: Keith Rake M.D.   On: 11/17/2018 19:11    ____________________________________________   PROCEDURES  Procedure(s) performed:   Procedures  None  ____________________________________________   INITIAL IMPRESSION / ASSESSMENT AND PLAN / ED COURSE  Pertinent labs & imaging results that were available during my care of the patient were reviewed by me and considered in my medical decision making (see chart for details).   Patient presents to the emergency department for evaluation after mechanical fall.  She has hematoma and abrasions to the face and chin.  No lacerations.  She is having a tingling in the bilateral hands. Plan for CT imaging and reassess.  CT imaging without acute findings. MRI obtained with hand symptoms.  Bilateral neuroforaminal narrowing which may be leading to the patient's symptoms.  Normal cord signal doubt central cord compression or disc bulging.  Cervical collar removed and patient provided contact information for spine surgery along with her PCP.  I discussed ED return precautions as well as wound care for her superficial facial abrasions.  ____________________________________________  FINAL CLINICAL IMPRESSION(S) / ED DIAGNOSES  Final diagnoses:  Injury of head, initial encounter   Fall, initial encounter  Contusion of face, initial encounter  Paresthesias/numbness    MEDICATIONS GIVEN DURING THIS VISIT:  Medications  fentaNYL (SUBLIMAZE) injection 50 mcg (50 mcg Intravenous Given 11/17/18 1802)  morphine 4 MG/ML injection 4 mg (4 mg Intravenous Given 11/17/18 1936)    Note:  This document was prepared using Dragon voice recognition software and may include unintentional dictation errors.  Nanda Quinton, MD Emergency Medicine    Denario Bagot, [facility], MD 11/17/18 [phone]

## 2018-11-20 ENCOUNTER — Encounter (HOSPITAL_COMMUNITY): Payer: Self-pay

## 2018-11-21 DIAGNOSIS — M9902 Segmental and somatic dysfunction of thoracic region: Secondary | ICD-10-CM | POA: Diagnosis not present

## 2018-11-21 DIAGNOSIS — M9901 Segmental and somatic dysfunction of cervical region: Secondary | ICD-10-CM | POA: Diagnosis not present

## 2018-11-21 DIAGNOSIS — M542 Cervicalgia: Secondary | ICD-10-CM | POA: Diagnosis not present

## 2018-11-21 DIAGNOSIS — M546 Pain in thoracic spine: Secondary | ICD-10-CM | POA: Diagnosis not present

## 2018-11-22 DIAGNOSIS — M9901 Segmental and somatic dysfunction of cervical region: Secondary | ICD-10-CM | POA: Diagnosis not present

## 2018-11-22 DIAGNOSIS — M542 Cervicalgia: Secondary | ICD-10-CM | POA: Diagnosis not present

## 2018-11-22 DIAGNOSIS — M546 Pain in thoracic spine: Secondary | ICD-10-CM | POA: Diagnosis not present

## 2018-11-22 DIAGNOSIS — M9902 Segmental and somatic dysfunction of thoracic region: Secondary | ICD-10-CM | POA: Diagnosis not present

## 2018-11-23 DIAGNOSIS — M542 Cervicalgia: Secondary | ICD-10-CM | POA: Diagnosis not present

## 2018-11-23 DIAGNOSIS — M546 Pain in thoracic spine: Secondary | ICD-10-CM | POA: Diagnosis not present

## 2018-11-23 DIAGNOSIS — M9902 Segmental and somatic dysfunction of thoracic region: Secondary | ICD-10-CM | POA: Diagnosis not present

## 2018-11-23 DIAGNOSIS — M9901 Segmental and somatic dysfunction of cervical region: Secondary | ICD-10-CM | POA: Diagnosis not present

## 2018-11-27 DIAGNOSIS — M9902 Segmental and somatic dysfunction of thoracic region: Secondary | ICD-10-CM | POA: Diagnosis not present

## 2018-11-27 DIAGNOSIS — M542 Cervicalgia: Secondary | ICD-10-CM | POA: Diagnosis not present

## 2018-11-27 DIAGNOSIS — M9901 Segmental and somatic dysfunction of cervical region: Secondary | ICD-10-CM | POA: Diagnosis not present

## 2018-11-27 DIAGNOSIS — M546 Pain in thoracic spine: Secondary | ICD-10-CM | POA: Diagnosis not present

## 2018-11-30 DIAGNOSIS — M9902 Segmental and somatic dysfunction of thoracic region: Secondary | ICD-10-CM | POA: Diagnosis not present

## 2018-11-30 DIAGNOSIS — M9901 Segmental and somatic dysfunction of cervical region: Secondary | ICD-10-CM | POA: Diagnosis not present

## 2018-11-30 DIAGNOSIS — M542 Cervicalgia: Secondary | ICD-10-CM | POA: Diagnosis not present

## 2018-11-30 DIAGNOSIS — M546 Pain in thoracic spine: Secondary | ICD-10-CM | POA: Diagnosis not present

## 2018-12-04 DIAGNOSIS — M9902 Segmental and somatic dysfunction of thoracic region: Secondary | ICD-10-CM | POA: Diagnosis not present

## 2018-12-04 DIAGNOSIS — M546 Pain in thoracic spine: Secondary | ICD-10-CM | POA: Diagnosis not present

## 2018-12-04 DIAGNOSIS — M9901 Segmental and somatic dysfunction of cervical region: Secondary | ICD-10-CM | POA: Diagnosis not present

## 2018-12-04 DIAGNOSIS — M542 Cervicalgia: Secondary | ICD-10-CM | POA: Diagnosis not present

## 2018-12-07 DIAGNOSIS — M542 Cervicalgia: Secondary | ICD-10-CM | POA: Diagnosis not present

## 2018-12-07 DIAGNOSIS — M546 Pain in thoracic spine: Secondary | ICD-10-CM | POA: Diagnosis not present

## 2018-12-07 DIAGNOSIS — M9901 Segmental and somatic dysfunction of cervical region: Secondary | ICD-10-CM | POA: Diagnosis not present

## 2018-12-07 DIAGNOSIS — M9902 Segmental and somatic dysfunction of thoracic region: Secondary | ICD-10-CM | POA: Diagnosis not present

## 2018-12-11 DIAGNOSIS — M9901 Segmental and somatic dysfunction of cervical region: Secondary | ICD-10-CM | POA: Diagnosis not present

## 2018-12-11 DIAGNOSIS — M9902 Segmental and somatic dysfunction of thoracic region: Secondary | ICD-10-CM | POA: Diagnosis not present

## 2018-12-11 DIAGNOSIS — M546 Pain in thoracic spine: Secondary | ICD-10-CM | POA: Diagnosis not present

## 2018-12-11 DIAGNOSIS — M542 Cervicalgia: Secondary | ICD-10-CM | POA: Diagnosis not present

## 2018-12-13 DIAGNOSIS — M542 Cervicalgia: Secondary | ICD-10-CM | POA: Diagnosis not present

## 2018-12-13 DIAGNOSIS — M546 Pain in thoracic spine: Secondary | ICD-10-CM | POA: Diagnosis not present

## 2018-12-13 DIAGNOSIS — M9901 Segmental and somatic dysfunction of cervical region: Secondary | ICD-10-CM | POA: Diagnosis not present

## 2018-12-13 DIAGNOSIS — M9902 Segmental and somatic dysfunction of thoracic region: Secondary | ICD-10-CM | POA: Diagnosis not present

## 2018-12-20 DIAGNOSIS — M9901 Segmental and somatic dysfunction of cervical region: Secondary | ICD-10-CM | POA: Diagnosis not present

## 2018-12-20 DIAGNOSIS — M542 Cervicalgia: Secondary | ICD-10-CM | POA: Diagnosis not present

## 2018-12-20 DIAGNOSIS — M546 Pain in thoracic spine: Secondary | ICD-10-CM | POA: Diagnosis not present

## 2018-12-20 DIAGNOSIS — M9902 Segmental and somatic dysfunction of thoracic region: Secondary | ICD-10-CM | POA: Diagnosis not present

## 2018-12-25 DIAGNOSIS — M542 Cervicalgia: Secondary | ICD-10-CM | POA: Diagnosis not present

## 2018-12-25 DIAGNOSIS — M9901 Segmental and somatic dysfunction of cervical region: Secondary | ICD-10-CM | POA: Diagnosis not present

## 2018-12-25 DIAGNOSIS — M546 Pain in thoracic spine: Secondary | ICD-10-CM | POA: Diagnosis not present

## 2018-12-25 DIAGNOSIS — M9902 Segmental and somatic dysfunction of thoracic region: Secondary | ICD-10-CM | POA: Diagnosis not present

## 2018-12-26 DIAGNOSIS — Z961 Presence of intraocular lens: Secondary | ICD-10-CM | POA: Diagnosis not present

## 2018-12-26 DIAGNOSIS — H524 Presbyopia: Secondary | ICD-10-CM | POA: Diagnosis not present

## 2018-12-26 DIAGNOSIS — H40013 Open angle with borderline findings, low risk, bilateral: Secondary | ICD-10-CM | POA: Diagnosis not present

## 2018-12-26 DIAGNOSIS — E119 Type 2 diabetes mellitus without complications: Secondary | ICD-10-CM | POA: Diagnosis not present

## 2018-12-26 DIAGNOSIS — H52203 Unspecified astigmatism, bilateral: Secondary | ICD-10-CM | POA: Diagnosis not present

## 2019-01-02 DIAGNOSIS — M542 Cervicalgia: Secondary | ICD-10-CM | POA: Diagnosis not present

## 2019-01-02 DIAGNOSIS — M9902 Segmental and somatic dysfunction of thoracic region: Secondary | ICD-10-CM | POA: Diagnosis not present

## 2019-01-02 DIAGNOSIS — M546 Pain in thoracic spine: Secondary | ICD-10-CM | POA: Diagnosis not present

## 2019-01-02 DIAGNOSIS — M9901 Segmental and somatic dysfunction of cervical region: Secondary | ICD-10-CM | POA: Diagnosis not present

## 2019-01-08 DIAGNOSIS — M546 Pain in thoracic spine: Secondary | ICD-10-CM | POA: Diagnosis not present

## 2019-01-08 DIAGNOSIS — M9901 Segmental and somatic dysfunction of cervical region: Secondary | ICD-10-CM | POA: Diagnosis not present

## 2019-01-08 DIAGNOSIS — M542 Cervicalgia: Secondary | ICD-10-CM | POA: Diagnosis not present

## 2019-01-08 DIAGNOSIS — M9902 Segmental and somatic dysfunction of thoracic region: Secondary | ICD-10-CM | POA: Diagnosis not present

## 2019-01-23 DIAGNOSIS — M9902 Segmental and somatic dysfunction of thoracic region: Secondary | ICD-10-CM | POA: Diagnosis not present

## 2019-01-23 DIAGNOSIS — M542 Cervicalgia: Secondary | ICD-10-CM | POA: Diagnosis not present

## 2019-01-23 DIAGNOSIS — M546 Pain in thoracic spine: Secondary | ICD-10-CM | POA: Diagnosis not present

## 2019-01-23 DIAGNOSIS — M9901 Segmental and somatic dysfunction of cervical region: Secondary | ICD-10-CM | POA: Diagnosis not present

## 2019-02-06 DIAGNOSIS — M25562 Pain in left knee: Secondary | ICD-10-CM | POA: Diagnosis not present

## 2019-02-13 DIAGNOSIS — M1712 Unilateral primary osteoarthritis, left knee: Secondary | ICD-10-CM | POA: Diagnosis not present

## 2019-02-13 DIAGNOSIS — M25462 Effusion, left knee: Secondary | ICD-10-CM | POA: Diagnosis not present

## 2019-02-20 DIAGNOSIS — M542 Cervicalgia: Secondary | ICD-10-CM | POA: Diagnosis not present

## 2019-02-20 DIAGNOSIS — M546 Pain in thoracic spine: Secondary | ICD-10-CM | POA: Diagnosis not present

## 2019-02-20 DIAGNOSIS — M9902 Segmental and somatic dysfunction of thoracic region: Secondary | ICD-10-CM | POA: Diagnosis not present

## 2019-02-20 DIAGNOSIS — M9901 Segmental and somatic dysfunction of cervical region: Secondary | ICD-10-CM | POA: Diagnosis not present

## 2019-03-20 DIAGNOSIS — M542 Cervicalgia: Secondary | ICD-10-CM | POA: Diagnosis not present

## 2019-03-20 DIAGNOSIS — M9902 Segmental and somatic dysfunction of thoracic region: Secondary | ICD-10-CM | POA: Diagnosis not present

## 2019-03-20 DIAGNOSIS — M9901 Segmental and somatic dysfunction of cervical region: Secondary | ICD-10-CM | POA: Diagnosis not present

## 2019-03-20 DIAGNOSIS — M546 Pain in thoracic spine: Secondary | ICD-10-CM | POA: Diagnosis not present

## 2019-03-29 DIAGNOSIS — I10 Essential (primary) hypertension: Secondary | ICD-10-CM | POA: Diagnosis not present

## 2019-03-29 DIAGNOSIS — E782 Mixed hyperlipidemia: Secondary | ICD-10-CM | POA: Diagnosis not present

## 2019-03-29 DIAGNOSIS — F411 Generalized anxiety disorder: Secondary | ICD-10-CM | POA: Diagnosis not present

## 2019-03-29 DIAGNOSIS — E119 Type 2 diabetes mellitus without complications: Secondary | ICD-10-CM | POA: Diagnosis not present

## 2019-03-29 DIAGNOSIS — F3342 Major depressive disorder, recurrent, in full remission: Secondary | ICD-10-CM | POA: Diagnosis not present

## 2019-03-29 DIAGNOSIS — Z0000 Encounter for general adult medical examination without abnormal findings: Secondary | ICD-10-CM | POA: Diagnosis not present

## 2019-03-29 DIAGNOSIS — K591 Functional diarrhea: Secondary | ICD-10-CM | POA: Diagnosis not present

## 2019-03-29 DIAGNOSIS — D691 Qualitative platelet defects: Secondary | ICD-10-CM | POA: Diagnosis not present

## 2019-03-29 DIAGNOSIS — Z87891 Personal history of nicotine dependence: Secondary | ICD-10-CM | POA: Diagnosis not present

## 2019-03-29 DIAGNOSIS — E039 Hypothyroidism, unspecified: Secondary | ICD-10-CM | POA: Diagnosis not present

## 2019-03-29 DIAGNOSIS — M7989 Other specified soft tissue disorders: Secondary | ICD-10-CM | POA: Diagnosis not present

## 2019-04-02 DIAGNOSIS — M7122 Synovial cyst of popliteal space [Baker], left knee: Secondary | ICD-10-CM | POA: Diagnosis not present

## 2019-04-02 DIAGNOSIS — M7989 Other specified soft tissue disorders: Secondary | ICD-10-CM | POA: Diagnosis not present

## 2019-04-02 DIAGNOSIS — R600 Localized edema: Secondary | ICD-10-CM | POA: Diagnosis not present

## 2019-04-24 DIAGNOSIS — M9902 Segmental and somatic dysfunction of thoracic region: Secondary | ICD-10-CM | POA: Diagnosis not present

## 2019-04-24 DIAGNOSIS — M542 Cervicalgia: Secondary | ICD-10-CM | POA: Diagnosis not present

## 2019-04-24 DIAGNOSIS — M546 Pain in thoracic spine: Secondary | ICD-10-CM | POA: Diagnosis not present

## 2019-04-24 DIAGNOSIS — M9901 Segmental and somatic dysfunction of cervical region: Secondary | ICD-10-CM | POA: Diagnosis not present

## 2019-05-03 DIAGNOSIS — Z853 Personal history of malignant neoplasm of breast: Secondary | ICD-10-CM | POA: Diagnosis not present

## 2019-05-03 DIAGNOSIS — Z1231 Encounter for screening mammogram for malignant neoplasm of breast: Secondary | ICD-10-CM | POA: Diagnosis not present

## 2019-05-22 DIAGNOSIS — M546 Pain in thoracic spine: Secondary | ICD-10-CM | POA: Diagnosis not present

## 2019-05-22 DIAGNOSIS — M9901 Segmental and somatic dysfunction of cervical region: Secondary | ICD-10-CM | POA: Diagnosis not present

## 2019-05-22 DIAGNOSIS — M9902 Segmental and somatic dysfunction of thoracic region: Secondary | ICD-10-CM | POA: Diagnosis not present

## 2019-05-22 DIAGNOSIS — M542 Cervicalgia: Secondary | ICD-10-CM | POA: Diagnosis not present

## 2019-06-06 DIAGNOSIS — D691 Qualitative platelet defects: Secondary | ICD-10-CM | POA: Diagnosis not present

## 2019-06-06 DIAGNOSIS — E039 Hypothyroidism, unspecified: Secondary | ICD-10-CM | POA: Diagnosis not present

## 2019-06-27 DIAGNOSIS — M9901 Segmental and somatic dysfunction of cervical region: Secondary | ICD-10-CM | POA: Diagnosis not present

## 2019-06-27 DIAGNOSIS — M542 Cervicalgia: Secondary | ICD-10-CM | POA: Diagnosis not present

## 2019-06-27 DIAGNOSIS — M9902 Segmental and somatic dysfunction of thoracic region: Secondary | ICD-10-CM | POA: Diagnosis not present

## 2019-06-27 DIAGNOSIS — M546 Pain in thoracic spine: Secondary | ICD-10-CM | POA: Diagnosis not present

## 2019-07-24 DIAGNOSIS — L820 Inflamed seborrheic keratosis: Secondary | ICD-10-CM | POA: Diagnosis not present

## 2019-07-24 DIAGNOSIS — D485 Neoplasm of uncertain behavior of skin: Secondary | ICD-10-CM | POA: Diagnosis not present

## 2019-07-24 DIAGNOSIS — L570 Actinic keratosis: Secondary | ICD-10-CM | POA: Diagnosis not present

## 2019-07-24 DIAGNOSIS — L821 Other seborrheic keratosis: Secondary | ICD-10-CM | POA: Diagnosis not present

## 2019-07-24 DIAGNOSIS — Z23 Encounter for immunization: Secondary | ICD-10-CM | POA: Diagnosis not present

## 2019-07-24 DIAGNOSIS — D1801 Hemangioma of skin and subcutaneous tissue: Secondary | ICD-10-CM | POA: Diagnosis not present

## 2019-07-24 DIAGNOSIS — D225 Melanocytic nevi of trunk: Secondary | ICD-10-CM | POA: Diagnosis not present

## 2019-07-30 DIAGNOSIS — D7589 Other specified diseases of blood and blood-forming organs: Secondary | ICD-10-CM | POA: Diagnosis not present

## 2019-07-30 DIAGNOSIS — C50911 Malignant neoplasm of unspecified site of right female breast: Secondary | ICD-10-CM | POA: Diagnosis not present

## 2019-07-30 DIAGNOSIS — D691 Qualitative platelet defects: Secondary | ICD-10-CM | POA: Diagnosis not present

## 2019-07-30 DIAGNOSIS — Z853 Personal history of malignant neoplasm of breast: Secondary | ICD-10-CM | POA: Diagnosis not present

## 2019-08-15 DIAGNOSIS — M542 Cervicalgia: Secondary | ICD-10-CM | POA: Diagnosis not present

## 2019-08-15 DIAGNOSIS — M9902 Segmental and somatic dysfunction of thoracic region: Secondary | ICD-10-CM | POA: Diagnosis not present

## 2019-08-15 DIAGNOSIS — M9901 Segmental and somatic dysfunction of cervical region: Secondary | ICD-10-CM | POA: Diagnosis not present

## 2019-08-15 DIAGNOSIS — M546 Pain in thoracic spine: Secondary | ICD-10-CM | POA: Diagnosis not present

## 2019-09-12 DIAGNOSIS — M542 Cervicalgia: Secondary | ICD-10-CM | POA: Diagnosis not present

## 2019-09-12 DIAGNOSIS — M9902 Segmental and somatic dysfunction of thoracic region: Secondary | ICD-10-CM | POA: Diagnosis not present

## 2019-09-12 DIAGNOSIS — M546 Pain in thoracic spine: Secondary | ICD-10-CM | POA: Diagnosis not present

## 2019-09-12 DIAGNOSIS — M9901 Segmental and somatic dysfunction of cervical region: Secondary | ICD-10-CM | POA: Diagnosis not present

## 2019-09-16 ENCOUNTER — Ambulatory Visit: Payer: PPO | Attending: Internal Medicine

## 2019-09-16 DIAGNOSIS — Z23 Encounter for immunization: Secondary | ICD-10-CM | POA: Insufficient documentation

## 2019-09-16 NOTE — Progress Notes (Signed)
   Covid-19 Vaccination Clinic  Name:  Christina Jensen    MRN: [id number] DOB: [birth year]  09/16/2019  [name] was observed post Covid-19 immunization for 15 minutes without incidence. She was provided with Vaccine Information Sheet and instruction to access the V-Safe system.   [name] was instructed to call 911 with any severe reactions post vaccine: Marland Kitchen Difficulty breathing  . Swelling of your face and throat  . A fast heartbeat  . A bad rash all over your body  . Dizziness and weakness    Immunizations Administered    Name Date Dose VIS Date Route   Pfizer COVID-19 Vaccine 09/16/2019  4:33 PM 0.3 mL 07/20/2019 Intramuscular   Manufacturer: [place]   Lot: [id number]   [place]: [id number]

## 2019-09-18 DIAGNOSIS — M25562 Pain in left knee: Secondary | ICD-10-CM | POA: Diagnosis not present

## 2019-09-18 DIAGNOSIS — M1712 Unilateral primary osteoarthritis, left knee: Secondary | ICD-10-CM | POA: Diagnosis not present

## 2019-10-03 DIAGNOSIS — K7581 Nonalcoholic steatohepatitis (NASH): Secondary | ICD-10-CM | POA: Diagnosis not present

## 2019-10-03 DIAGNOSIS — E039 Hypothyroidism, unspecified: Secondary | ICD-10-CM | POA: Diagnosis not present

## 2019-10-03 DIAGNOSIS — E782 Mixed hyperlipidemia: Secondary | ICD-10-CM | POA: Diagnosis not present

## 2019-10-03 DIAGNOSIS — I10 Essential (primary) hypertension: Secondary | ICD-10-CM | POA: Diagnosis not present

## 2019-10-03 DIAGNOSIS — F3342 Major depressive disorder, recurrent, in full remission: Secondary | ICD-10-CM | POA: Diagnosis not present

## 2019-10-03 DIAGNOSIS — E119 Type 2 diabetes mellitus without complications: Secondary | ICD-10-CM | POA: Diagnosis not present

## 2019-10-03 DIAGNOSIS — F411 Generalized anxiety disorder: Secondary | ICD-10-CM | POA: Diagnosis not present

## 2019-10-10 ENCOUNTER — Ambulatory Visit: Payer: PPO

## 2019-10-10 ENCOUNTER — Ambulatory Visit: Payer: PPO | Attending: Internal Medicine

## 2019-10-10 DIAGNOSIS — M542 Cervicalgia: Secondary | ICD-10-CM | POA: Diagnosis not present

## 2019-10-10 DIAGNOSIS — M546 Pain in thoracic spine: Secondary | ICD-10-CM | POA: Diagnosis not present

## 2019-10-10 DIAGNOSIS — M9901 Segmental and somatic dysfunction of cervical region: Secondary | ICD-10-CM | POA: Diagnosis not present

## 2019-10-10 DIAGNOSIS — M9902 Segmental and somatic dysfunction of thoracic region: Secondary | ICD-10-CM | POA: Diagnosis not present

## 2019-10-10 DIAGNOSIS — Z23 Encounter for immunization: Secondary | ICD-10-CM | POA: Insufficient documentation

## 2019-10-10 NOTE — Progress Notes (Signed)
   Covid-19 Vaccination Clinic  Name:  TEVIS VARISCO    MRN: [id number] DOB: 11/04/1950  10/10/2019  Christina Jensen was observed post Covid-19 immunization for 15 minutes without incident. She was provided with Vaccine Information Sheet and instruction to access the V-Safe system.   [name] was instructed to call 911 with any severe reactions post vaccine: Marland Kitchen Difficulty breathing  . Swelling of face and throat  . A fast heartbeat  . A bad rash all over body  . Dizziness and weakness   Immunizations Administered    Name Date Dose VIS Date Route   Pfizer COVID-19 Vaccine 10/10/2019  3:49 PM 0.3 mL 07/20/2019 Intramuscular   Manufacturer: [place]   Lot: [id number]   [place]: [id number]

## 2019-10-25 DIAGNOSIS — M65332 Trigger finger, left middle finger: Secondary | ICD-10-CM | POA: Diagnosis not present

## 2019-10-25 DIAGNOSIS — M79642 Pain in left hand: Secondary | ICD-10-CM | POA: Diagnosis not present

## 2019-10-30 DIAGNOSIS — K760 Fatty (change of) liver, not elsewhere classified: Secondary | ICD-10-CM | POA: Diagnosis not present

## 2019-10-30 DIAGNOSIS — R748 Abnormal levels of other serum enzymes: Secondary | ICD-10-CM | POA: Diagnosis not present

## 2019-10-30 DIAGNOSIS — K5730 Diverticulosis of large intestine without perforation or abscess without bleeding: Secondary | ICD-10-CM | POA: Diagnosis not present

## 2019-10-30 DIAGNOSIS — Z86010 Personal history of colonic polyps: Secondary | ICD-10-CM | POA: Diagnosis not present

## 2019-11-01 ENCOUNTER — Other Ambulatory Visit: Payer: Self-pay | Admitting: Gastroenterology

## 2019-11-01 DIAGNOSIS — R7989 Other specified abnormal findings of blood chemistry: Secondary | ICD-10-CM

## 2019-11-07 DIAGNOSIS — M542 Cervicalgia: Secondary | ICD-10-CM | POA: Diagnosis not present

## 2019-11-07 DIAGNOSIS — M546 Pain in thoracic spine: Secondary | ICD-10-CM | POA: Diagnosis not present

## 2019-11-07 DIAGNOSIS — M9902 Segmental and somatic dysfunction of thoracic region: Secondary | ICD-10-CM | POA: Diagnosis not present

## 2019-11-07 DIAGNOSIS — M9901 Segmental and somatic dysfunction of cervical region: Secondary | ICD-10-CM | POA: Diagnosis not present

## 2019-11-08 ENCOUNTER — Ambulatory Visit
Admission: RE | Admit: 2019-11-08 | Discharge: 2019-11-08 | Disposition: A | Discharge status: Home / Self Care | Payer: PPO | Source: Ambulatory Visit | Attending: Gastroenterology | Admitting: Gastroenterology

## 2019-11-08 DIAGNOSIS — R7989 Other specified abnormal findings of blood chemistry: Secondary | ICD-10-CM

## 2019-11-08 DIAGNOSIS — K760 Fatty (change of) liver, not elsewhere classified: Secondary | ICD-10-CM | POA: Diagnosis not present

## 2019-12-25 DIAGNOSIS — E039 Hypothyroidism, unspecified: Secondary | ICD-10-CM | POA: Diagnosis not present

## 2019-12-26 DIAGNOSIS — Z0389 Encounter for observation for other suspected diseases and conditions ruled out: Secondary | ICD-10-CM | POA: Diagnosis not present

## 2020-02-04 DIAGNOSIS — M1712 Unilateral primary osteoarthritis, left knee: Secondary | ICD-10-CM | POA: Diagnosis not present

## 2020-02-04 DIAGNOSIS — M25562 Pain in left knee: Secondary | ICD-10-CM | POA: Diagnosis not present

## 2020-02-05 DIAGNOSIS — E119 Type 2 diabetes mellitus without complications: Secondary | ICD-10-CM | POA: Diagnosis not present

## 2020-02-05 DIAGNOSIS — H52203 Unspecified astigmatism, bilateral: Secondary | ICD-10-CM | POA: Diagnosis not present

## 2020-02-05 DIAGNOSIS — H40013 Open angle with borderline findings, low risk, bilateral: Secondary | ICD-10-CM | POA: Diagnosis not present

## 2020-02-05 DIAGNOSIS — Z961 Presence of intraocular lens: Secondary | ICD-10-CM | POA: Diagnosis not present

## 2020-02-05 DIAGNOSIS — Z7984 Long term (current) use of oral hypoglycemic drugs: Secondary | ICD-10-CM | POA: Diagnosis not present

## 2020-02-05 DIAGNOSIS — H524 Presbyopia: Secondary | ICD-10-CM | POA: Diagnosis not present

## 2020-03-13 DIAGNOSIS — E039 Hypothyroidism, unspecified: Secondary | ICD-10-CM | POA: Diagnosis not present

## 2020-03-13 DIAGNOSIS — I10 Essential (primary) hypertension: Secondary | ICD-10-CM | POA: Diagnosis not present

## 2020-05-05 DIAGNOSIS — Z1231 Encounter for screening mammogram for malignant neoplasm of breast: Secondary | ICD-10-CM | POA: Diagnosis not present

## 2020-06-05 IMAGING — CT CT MAXILLOFACIAL WITHOUT CONTRAST
5 of 14 series · 16 of 47 positions shown, 17 images · non-contrast
Comparison: None.

CLINICAL DATA: Trip and fall landing on face. Head trauma, ataxia
fell on face; Neck pain, initial exam tingling to hands, fall; Head
trauma, ataxia

EXAM:
CT HEAD WITHOUT CONTRAST
CT MAXILLOFACIAL WITHOUT CONTRAST
CT CERVICAL SPINE WITHOUT CONTRAST
TECHNIQUE: Multidetector CT imaging of the head, cervical spine, and
maxillofacial structures were performed using the standard protocol
without intravenous contrast. Multiplanar CT image reconstructions
of the cervical spine and maxillofacial structures were also
generated.

[Series 5: head bone · axial · 0.46mm/px · z∈[-83,-1]mm · 3 of 83 slices shown]
[im 21/83  bone]
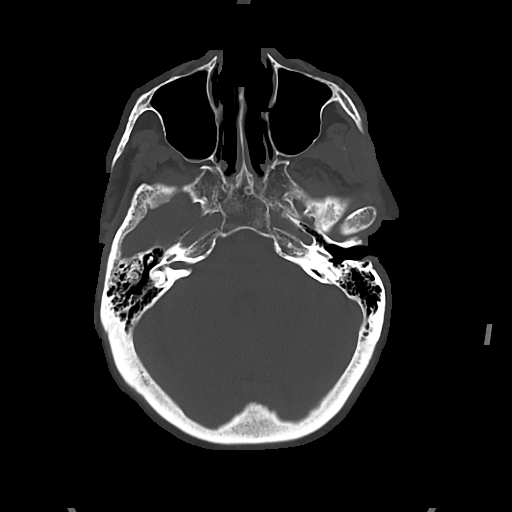
[im 42/83  bone]
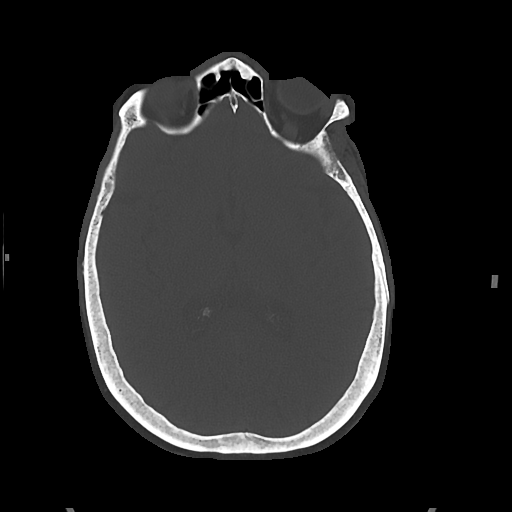
[im 62/83  bone]
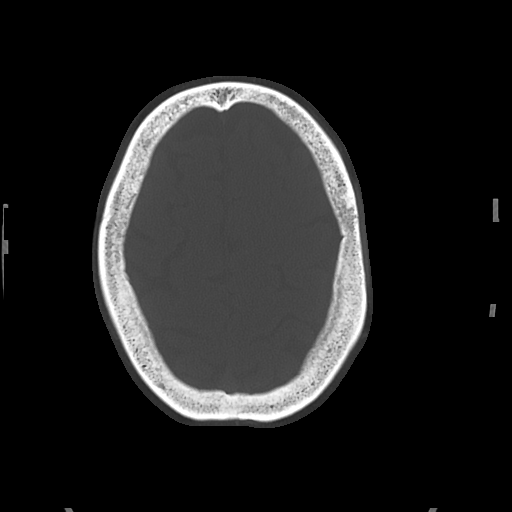

[Series 8: maxilllofacial 2.0 hr40 3 · axial · 0.33mm/px · z∈[-149,-35]mm · 4 of 96 slices shown, 5 images]
[im 20/96  brain]
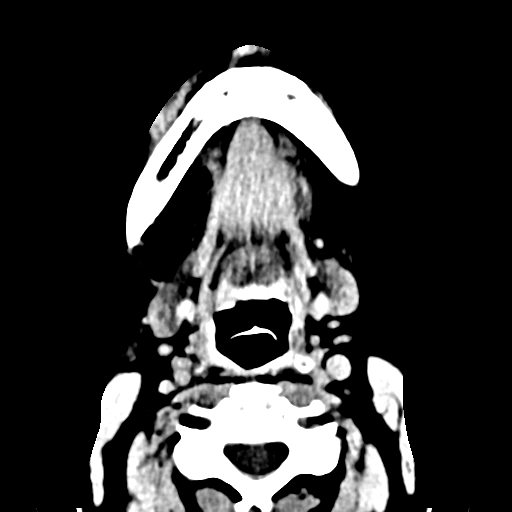
[im 20/96  bone]
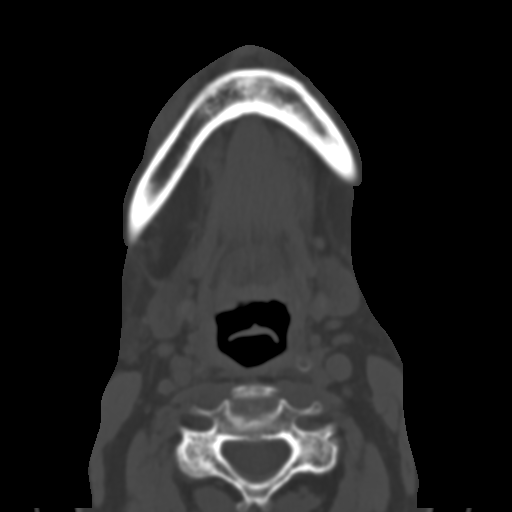
[im 39/96  bone]
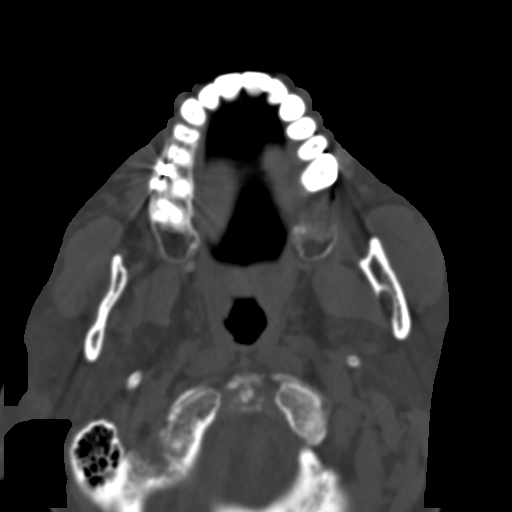
[im 58/96  bone]
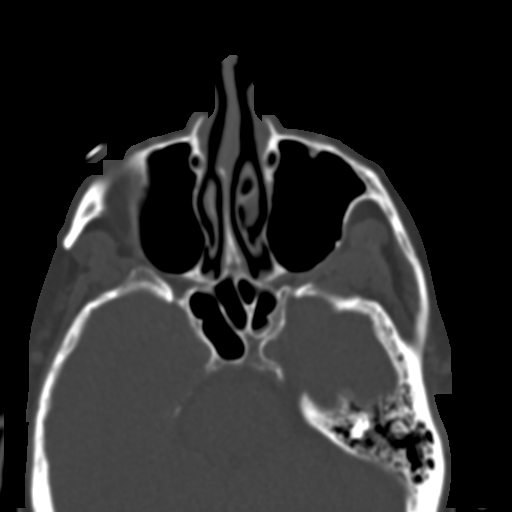
[im 77/96  bone]
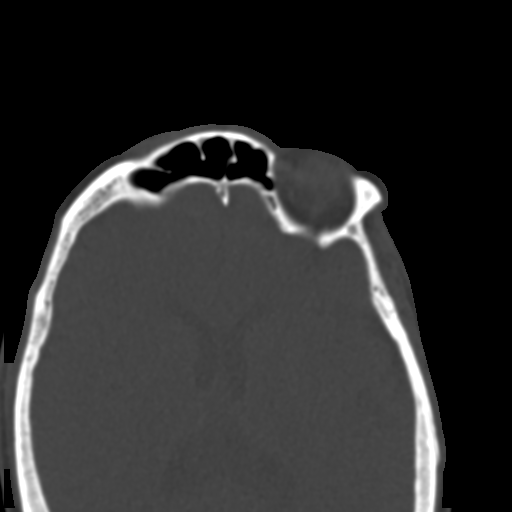

[Series 10: maxilllofacial 2.0 hr59 3 · axial · 0.33mm/px · z∈[-149,-35]mm · 4 of 96 slices shown]
[im 20/96  bone]
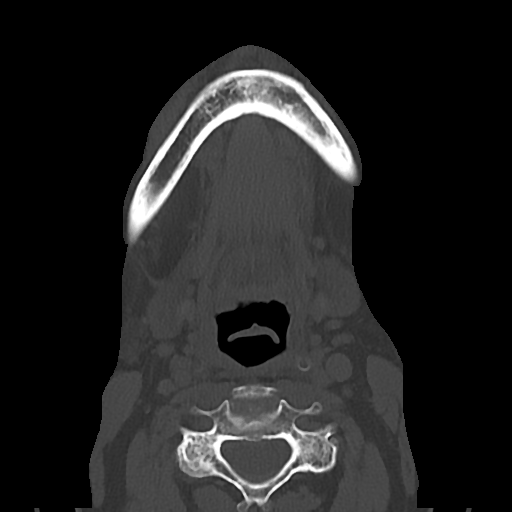
[im 39/96  bone]
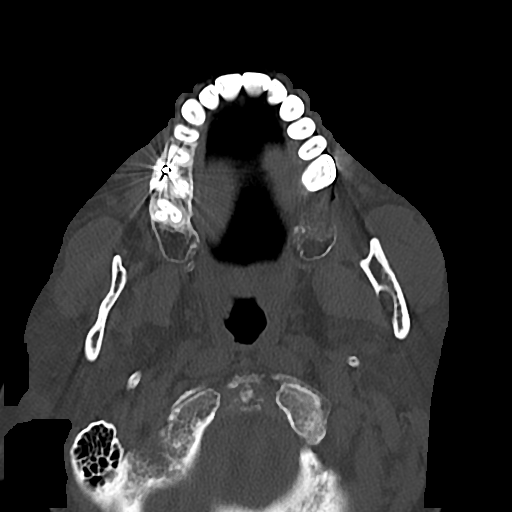
[im 58/96  bone]
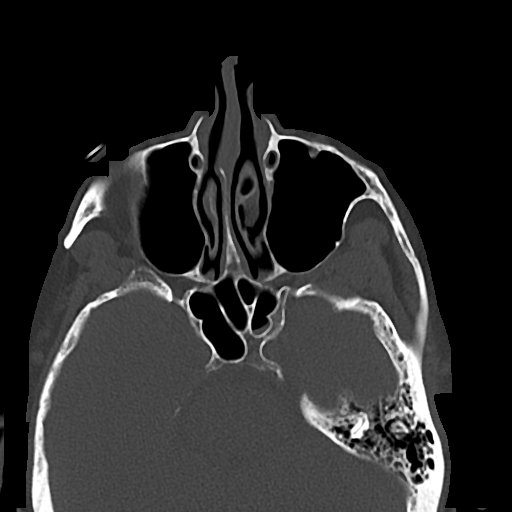
[im 77/96  bone]
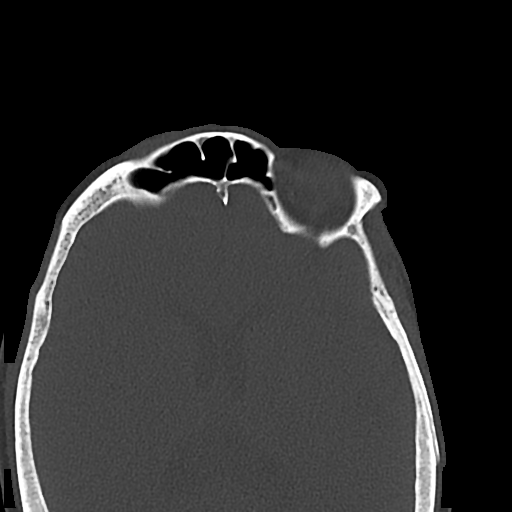

[Series 12: st cor · coronal · 0.37mm/px · 1 of 95 slices shown]
[im 48/95  bone]
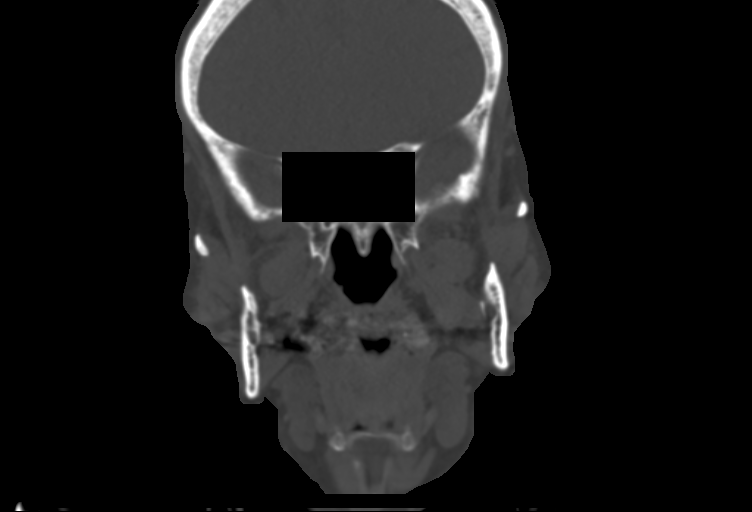

[Series 20: orthogonal axials · axial · 0.21mm/px · z∈[-245,-153]mm · 4 of 96 slices shown]
[im 20/96  bone]
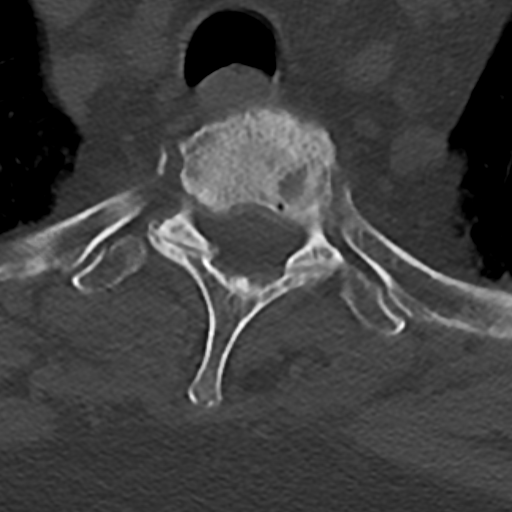
[im 39/96  bone]
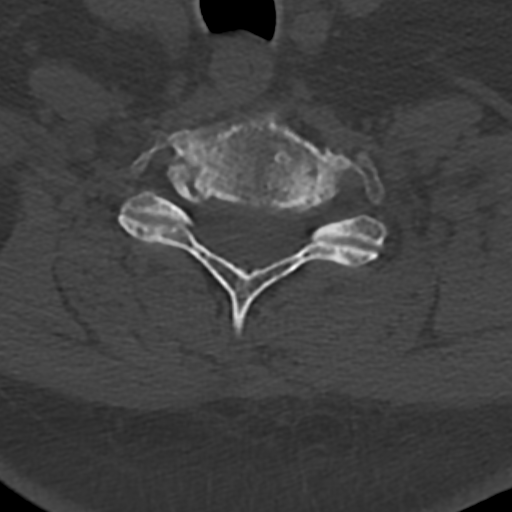
[im 58/96  bone]
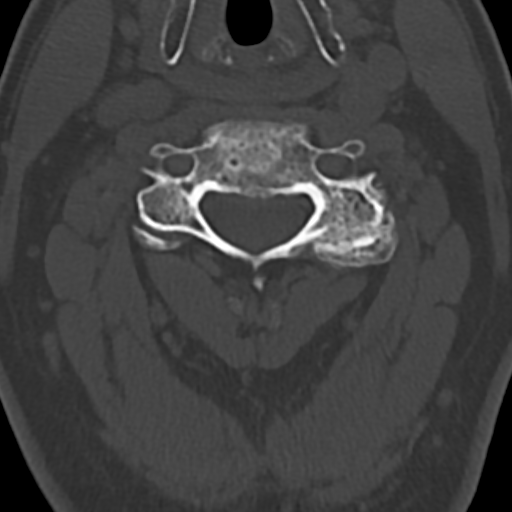
[im 77/96  bone]
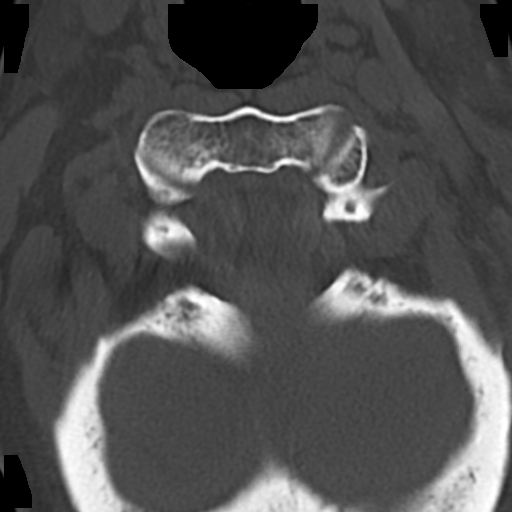

[16 of 47 positions shown; findings below may reference images not displayed]

FINDINGS: CT HEAD FINDINGS

Brain: No intracranial hemorrhage, mass effect, or midline shift. No
hydrocephalus. The basilar cisterns are patent. No evidence of
territorial infarct or acute ischemia. No extra-axial or
intracranial fluid collection.

Vascular: No hyperdense vessel.

Skull: No fracture or focal lesion.

Other: None.

CT MAXILLOFACIAL FINDINGS

Osseous: Zygomatic arches, mandibles, and nasal bone are intact.
Temporomandibular joints are congruent.

Orbits: No orbital fracture. Both orbits and globes are intact.
Bilateral lens resection.

Sinuses: Clear. No sinus fracture or fluid level.

Soft tissues: Scattered soft tissue edema overlying the right trauma
and supraorbital soft tissues. No radiopaque foreign body.

CT CERVICAL SPINE FINDINGS

Alignment: Trace anterolisthesis C4 on C5 is likely degenerative and
facet mediated. Trace anterolisthesis of C7 on T1 also likely
degenerative. No traumatic subluxation. Facets are normally aligned.

Skull base and vertebrae: No acute fracture. Vertebral body heights
are maintained. The dens and skull base are intact.

Soft tissues and spinal canal: No prevertebral fluid or swelling. No
visible canal hematoma.

Disc levels: Multilevel disc space narrowing, endplate spurring, and
endplate irregularity throughout the entire cervical spine. Findings
are most prominent at C5-C6 and C6-C7. Multilevel facet arthropathy.
No evidence of bony canal stenosis. There is scattered neural
foraminal stenosis.

Upper chest: No acute finding.

Other: None.
IMPRESSION: 1. No acute intracranial abnormality. No skull fracture.
2. No facial bone fracture. Scattered soft tissue edema about the
face.
3. Multilevel degenerative change throughout the cervical spine
without acute fracture or subluxation.

## 2020-06-20 ENCOUNTER — Other Ambulatory Visit: Payer: Self-pay | Admitting: Family Medicine

## 2020-06-20 DIAGNOSIS — I10 Essential (primary) hypertension: Secondary | ICD-10-CM | POA: Diagnosis not present

## 2020-06-20 DIAGNOSIS — E039 Hypothyroidism, unspecified: Secondary | ICD-10-CM | POA: Diagnosis not present

## 2020-06-20 DIAGNOSIS — F411 Generalized anxiety disorder: Secondary | ICD-10-CM | POA: Diagnosis not present

## 2020-06-20 DIAGNOSIS — F3342 Major depressive disorder, recurrent, in full remission: Secondary | ICD-10-CM | POA: Diagnosis not present

## 2020-06-20 DIAGNOSIS — K7581 Nonalcoholic steatohepatitis (NASH): Secondary | ICD-10-CM | POA: Diagnosis not present

## 2020-06-20 DIAGNOSIS — E782 Mixed hyperlipidemia: Secondary | ICD-10-CM | POA: Diagnosis not present

## 2020-06-20 DIAGNOSIS — E119 Type 2 diabetes mellitus without complications: Secondary | ICD-10-CM | POA: Diagnosis not present

## 2020-06-20 DIAGNOSIS — Z853 Personal history of malignant neoplasm of breast: Secondary | ICD-10-CM | POA: Diagnosis not present

## 2020-06-20 DIAGNOSIS — K591 Functional diarrhea: Secondary | ICD-10-CM | POA: Diagnosis not present

## 2020-06-20 DIAGNOSIS — Z0000 Encounter for general adult medical examination without abnormal findings: Secondary | ICD-10-CM | POA: Diagnosis not present

## 2020-07-09 DIAGNOSIS — M65331 Trigger finger, right middle finger: Secondary | ICD-10-CM | POA: Diagnosis not present

## 2020-07-14 ENCOUNTER — Ambulatory Visit
Admission: RE | Admit: 2020-07-14 | Discharge: 2020-07-14 | Disposition: A | Discharge status: Home / Self Care | Payer: PPO | Source: Ambulatory Visit | Attending: Family Medicine | Admitting: Family Medicine

## 2020-07-14 DIAGNOSIS — K7581 Nonalcoholic steatohepatitis (NASH): Secondary | ICD-10-CM | POA: Diagnosis not present

## 2020-08-12 DIAGNOSIS — M546 Pain in thoracic spine: Secondary | ICD-10-CM | POA: Diagnosis not present

## 2020-08-12 DIAGNOSIS — M9902 Segmental and somatic dysfunction of thoracic region: Secondary | ICD-10-CM | POA: Diagnosis not present

## 2020-08-12 DIAGNOSIS — M9901 Segmental and somatic dysfunction of cervical region: Secondary | ICD-10-CM | POA: Diagnosis not present

## 2020-08-12 DIAGNOSIS — M542 Cervicalgia: Secondary | ICD-10-CM | POA: Diagnosis not present

## 2020-08-28 DIAGNOSIS — M542 Cervicalgia: Secondary | ICD-10-CM | POA: Diagnosis not present

## 2020-08-28 DIAGNOSIS — M546 Pain in thoracic spine: Secondary | ICD-10-CM | POA: Diagnosis not present

## 2020-08-28 DIAGNOSIS — M9901 Segmental and somatic dysfunction of cervical region: Secondary | ICD-10-CM | POA: Diagnosis not present

## 2020-08-28 DIAGNOSIS — M9902 Segmental and somatic dysfunction of thoracic region: Secondary | ICD-10-CM | POA: Diagnosis not present

## 2020-09-10 DIAGNOSIS — M9901 Segmental and somatic dysfunction of cervical region: Secondary | ICD-10-CM | POA: Diagnosis not present

## 2020-09-10 DIAGNOSIS — M9902 Segmental and somatic dysfunction of thoracic region: Secondary | ICD-10-CM | POA: Diagnosis not present

## 2020-09-10 DIAGNOSIS — M542 Cervicalgia: Secondary | ICD-10-CM | POA: Diagnosis not present

## 2020-09-10 DIAGNOSIS — M546 Pain in thoracic spine: Secondary | ICD-10-CM | POA: Diagnosis not present

## 2020-09-11 DIAGNOSIS — M546 Pain in thoracic spine: Secondary | ICD-10-CM | POA: Diagnosis not present

## 2020-09-11 DIAGNOSIS — M9901 Segmental and somatic dysfunction of cervical region: Secondary | ICD-10-CM | POA: Diagnosis not present

## 2020-09-11 DIAGNOSIS — M542 Cervicalgia: Secondary | ICD-10-CM | POA: Diagnosis not present

## 2020-09-11 DIAGNOSIS — M9902 Segmental and somatic dysfunction of thoracic region: Secondary | ICD-10-CM | POA: Diagnosis not present

## 2020-09-15 DIAGNOSIS — M9901 Segmental and somatic dysfunction of cervical region: Secondary | ICD-10-CM | POA: Diagnosis not present

## 2020-09-15 DIAGNOSIS — M542 Cervicalgia: Secondary | ICD-10-CM | POA: Diagnosis not present

## 2020-09-15 DIAGNOSIS — M546 Pain in thoracic spine: Secondary | ICD-10-CM | POA: Diagnosis not present

## 2020-09-15 DIAGNOSIS — M9902 Segmental and somatic dysfunction of thoracic region: Secondary | ICD-10-CM | POA: Diagnosis not present

## 2020-09-18 DIAGNOSIS — M542 Cervicalgia: Secondary | ICD-10-CM | POA: Diagnosis not present

## 2020-09-18 DIAGNOSIS — M546 Pain in thoracic spine: Secondary | ICD-10-CM | POA: Diagnosis not present

## 2020-09-18 DIAGNOSIS — M9902 Segmental and somatic dysfunction of thoracic region: Secondary | ICD-10-CM | POA: Diagnosis not present

## 2020-09-18 DIAGNOSIS — M9901 Segmental and somatic dysfunction of cervical region: Secondary | ICD-10-CM | POA: Diagnosis not present

## 2020-09-19 DIAGNOSIS — F330 Major depressive disorder, recurrent, mild: Secondary | ICD-10-CM | POA: Diagnosis not present

## 2020-09-19 DIAGNOSIS — K7581 Nonalcoholic steatohepatitis (NASH): Secondary | ICD-10-CM | POA: Diagnosis not present

## 2020-09-19 DIAGNOSIS — E119 Type 2 diabetes mellitus without complications: Secondary | ICD-10-CM | POA: Diagnosis not present

## 2020-09-22 DIAGNOSIS — M546 Pain in thoracic spine: Secondary | ICD-10-CM | POA: Diagnosis not present

## 2020-09-22 DIAGNOSIS — M9902 Segmental and somatic dysfunction of thoracic region: Secondary | ICD-10-CM | POA: Diagnosis not present

## 2020-09-22 DIAGNOSIS — M9901 Segmental and somatic dysfunction of cervical region: Secondary | ICD-10-CM | POA: Diagnosis not present

## 2020-09-22 DIAGNOSIS — M542 Cervicalgia: Secondary | ICD-10-CM | POA: Diagnosis not present

## 2020-09-24 DIAGNOSIS — M542 Cervicalgia: Secondary | ICD-10-CM | POA: Diagnosis not present

## 2020-09-24 DIAGNOSIS — M9901 Segmental and somatic dysfunction of cervical region: Secondary | ICD-10-CM | POA: Diagnosis not present

## 2020-09-24 DIAGNOSIS — M9902 Segmental and somatic dysfunction of thoracic region: Secondary | ICD-10-CM | POA: Diagnosis not present

## 2020-09-24 DIAGNOSIS — M546 Pain in thoracic spine: Secondary | ICD-10-CM | POA: Diagnosis not present

## 2020-09-26 DIAGNOSIS — M1712 Unilateral primary osteoarthritis, left knee: Secondary | ICD-10-CM | POA: Diagnosis not present

## 2020-09-26 DIAGNOSIS — S76312A Strain of muscle, fascia and tendon of the posterior muscle group at thigh level, left thigh, initial encounter: Secondary | ICD-10-CM | POA: Diagnosis not present

## 2020-09-29 DIAGNOSIS — M546 Pain in thoracic spine: Secondary | ICD-10-CM | POA: Diagnosis not present

## 2020-09-29 DIAGNOSIS — M9901 Segmental and somatic dysfunction of cervical region: Secondary | ICD-10-CM | POA: Diagnosis not present

## 2020-09-29 DIAGNOSIS — M9902 Segmental and somatic dysfunction of thoracic region: Secondary | ICD-10-CM | POA: Diagnosis not present

## 2020-09-29 DIAGNOSIS — M542 Cervicalgia: Secondary | ICD-10-CM | POA: Diagnosis not present

## 2020-10-07 DIAGNOSIS — S76312A Strain of muscle, fascia and tendon of the posterior muscle group at thigh level, left thigh, initial encounter: Secondary | ICD-10-CM | POA: Diagnosis not present

## 2020-10-07 DIAGNOSIS — M25562 Pain in left knee: Secondary | ICD-10-CM | POA: Diagnosis not present

## 2020-10-16 DIAGNOSIS — M542 Cervicalgia: Secondary | ICD-10-CM | POA: Diagnosis not present

## 2020-10-16 DIAGNOSIS — M546 Pain in thoracic spine: Secondary | ICD-10-CM | POA: Diagnosis not present

## 2020-10-16 DIAGNOSIS — M9901 Segmental and somatic dysfunction of cervical region: Secondary | ICD-10-CM | POA: Diagnosis not present

## 2020-10-16 DIAGNOSIS — M9902 Segmental and somatic dysfunction of thoracic region: Secondary | ICD-10-CM | POA: Diagnosis not present

## 2020-10-17 DIAGNOSIS — S76312A Strain of muscle, fascia and tendon of the posterior muscle group at thigh level, left thigh, initial encounter: Secondary | ICD-10-CM | POA: Diagnosis not present

## 2020-10-17 DIAGNOSIS — M25562 Pain in left knee: Secondary | ICD-10-CM | POA: Diagnosis not present

## 2020-10-21 DIAGNOSIS — S76312A Strain of muscle, fascia and tendon of the posterior muscle group at thigh level, left thigh, initial encounter: Secondary | ICD-10-CM | POA: Diagnosis not present

## 2020-10-21 DIAGNOSIS — M25562 Pain in left knee: Secondary | ICD-10-CM | POA: Diagnosis not present

## 2020-10-23 DIAGNOSIS — S76312D Strain of muscle, fascia and tendon of the posterior muscle group at thigh level, left thigh, subsequent encounter: Secondary | ICD-10-CM | POA: Diagnosis not present

## 2020-10-23 DIAGNOSIS — M25562 Pain in left knee: Secondary | ICD-10-CM | POA: Diagnosis not present

## 2020-10-28 DIAGNOSIS — S76312A Strain of muscle, fascia and tendon of the posterior muscle group at thigh level, left thigh, initial encounter: Secondary | ICD-10-CM | POA: Diagnosis not present

## 2020-10-28 DIAGNOSIS — M25562 Pain in left knee: Secondary | ICD-10-CM | POA: Diagnosis not present

## 2020-10-30 DIAGNOSIS — M9902 Segmental and somatic dysfunction of thoracic region: Secondary | ICD-10-CM | POA: Diagnosis not present

## 2020-10-30 DIAGNOSIS — L814 Other melanin hyperpigmentation: Secondary | ICD-10-CM | POA: Diagnosis not present

## 2020-10-30 DIAGNOSIS — L578 Other skin changes due to chronic exposure to nonionizing radiation: Secondary | ICD-10-CM | POA: Diagnosis not present

## 2020-10-30 DIAGNOSIS — D1801 Hemangioma of skin and subcutaneous tissue: Secondary | ICD-10-CM | POA: Diagnosis not present

## 2020-10-30 DIAGNOSIS — M542 Cervicalgia: Secondary | ICD-10-CM | POA: Diagnosis not present

## 2020-10-30 DIAGNOSIS — D485 Neoplasm of uncertain behavior of skin: Secondary | ICD-10-CM | POA: Diagnosis not present

## 2020-10-30 DIAGNOSIS — M9901 Segmental and somatic dysfunction of cervical region: Secondary | ICD-10-CM | POA: Diagnosis not present

## 2020-10-30 DIAGNOSIS — L821 Other seborrheic keratosis: Secondary | ICD-10-CM | POA: Diagnosis not present

## 2020-10-30 DIAGNOSIS — D2271 Melanocytic nevi of right lower limb, including hip: Secondary | ICD-10-CM | POA: Diagnosis not present

## 2020-10-30 DIAGNOSIS — L570 Actinic keratosis: Secondary | ICD-10-CM | POA: Diagnosis not present

## 2020-10-30 DIAGNOSIS — M546 Pain in thoracic spine: Secondary | ICD-10-CM | POA: Diagnosis not present

## 2020-10-30 DIAGNOSIS — L304 Erythema intertrigo: Secondary | ICD-10-CM | POA: Diagnosis not present

## 2020-10-31 DIAGNOSIS — M25562 Pain in left knee: Secondary | ICD-10-CM | POA: Diagnosis not present

## 2020-10-31 DIAGNOSIS — S76312A Strain of muscle, fascia and tendon of the posterior muscle group at thigh level, left thigh, initial encounter: Secondary | ICD-10-CM | POA: Diagnosis not present

## 2020-11-06 DIAGNOSIS — S76312D Strain of muscle, fascia and tendon of the posterior muscle group at thigh level, left thigh, subsequent encounter: Secondary | ICD-10-CM | POA: Diagnosis not present

## 2020-11-06 DIAGNOSIS — M25562 Pain in left knee: Secondary | ICD-10-CM | POA: Diagnosis not present

## 2020-11-13 DIAGNOSIS — M542 Cervicalgia: Secondary | ICD-10-CM | POA: Diagnosis not present

## 2020-11-13 DIAGNOSIS — M546 Pain in thoracic spine: Secondary | ICD-10-CM | POA: Diagnosis not present

## 2020-11-13 DIAGNOSIS — M9902 Segmental and somatic dysfunction of thoracic region: Secondary | ICD-10-CM | POA: Diagnosis not present

## 2020-11-13 DIAGNOSIS — M9901 Segmental and somatic dysfunction of cervical region: Secondary | ICD-10-CM | POA: Diagnosis not present

## 2020-11-27 DIAGNOSIS — M1712 Unilateral primary osteoarthritis, left knee: Secondary | ICD-10-CM | POA: Diagnosis not present

## 2020-11-27 DIAGNOSIS — S76312D Strain of muscle, fascia and tendon of the posterior muscle group at thigh level, left thigh, subsequent encounter: Secondary | ICD-10-CM | POA: Diagnosis not present

## 2020-12-16 DIAGNOSIS — M546 Pain in thoracic spine: Secondary | ICD-10-CM | POA: Diagnosis not present

## 2020-12-16 DIAGNOSIS — M9902 Segmental and somatic dysfunction of thoracic region: Secondary | ICD-10-CM | POA: Diagnosis not present

## 2020-12-16 DIAGNOSIS — M542 Cervicalgia: Secondary | ICD-10-CM | POA: Diagnosis not present

## 2020-12-16 DIAGNOSIS — M9901 Segmental and somatic dysfunction of cervical region: Secondary | ICD-10-CM | POA: Diagnosis not present

## 2020-12-18 DIAGNOSIS — R809 Proteinuria, unspecified: Secondary | ICD-10-CM | POA: Diagnosis not present

## 2020-12-18 DIAGNOSIS — F330 Major depressive disorder, recurrent, mild: Secondary | ICD-10-CM | POA: Diagnosis not present

## 2020-12-18 DIAGNOSIS — E039 Hypothyroidism, unspecified: Secondary | ICD-10-CM | POA: Diagnosis not present

## 2020-12-18 DIAGNOSIS — E119 Type 2 diabetes mellitus without complications: Secondary | ICD-10-CM | POA: Diagnosis not present

## 2020-12-18 DIAGNOSIS — E782 Mixed hyperlipidemia: Secondary | ICD-10-CM | POA: Diagnosis not present

## 2020-12-18 DIAGNOSIS — K7581 Nonalcoholic steatohepatitis (NASH): Secondary | ICD-10-CM | POA: Diagnosis not present

## 2020-12-18 DIAGNOSIS — F411 Generalized anxiety disorder: Secondary | ICD-10-CM | POA: Diagnosis not present

## 2020-12-18 DIAGNOSIS — Z853 Personal history of malignant neoplasm of breast: Secondary | ICD-10-CM | POA: Diagnosis not present

## 2020-12-18 DIAGNOSIS — K591 Functional diarrhea: Secondary | ICD-10-CM | POA: Diagnosis not present

## 2020-12-18 DIAGNOSIS — I10 Essential (primary) hypertension: Secondary | ICD-10-CM | POA: Diagnosis not present

## 2020-12-18 DIAGNOSIS — Z87891 Personal history of nicotine dependence: Secondary | ICD-10-CM | POA: Diagnosis not present

## 2020-12-24 NOTE — Patient Instructions (Addendum)
DUE TO COVID-19 ONLY ONE VISITOR IS ALLOWED TO COME WITH YOU AND STAY IN THE WAITING ROOM ONLY DURING PRE OP AND PROCEDURE DAY OF SURGERY. THE 2 VISITORS MAY VISIT WITH YOU AFTER SURGERY IN YOUR PRIVATE ROOM DURING VISITING HOURS ONLY!  YOU NEED TO HAVE A COVID 19 TEST ON___5/20____ @_10 :20____, THIS TEST MUST BE DONE BEFORE SURGERY,  COVID TESTING SITE [address] [place]  [id number], IT IS ON THE RIGHT GOING OUT [address] APPROXIMATELY  2 MINUTES PAST [employer] ON THE RIGHT. ONCE YOUR COVID TEST IS COMPLETED,  PLEASE BEGIN THE QUARANTINE INSTRUCTIONS AS OUTLINED IN YOUR HANDOUT.                LIBERTIE HAUSLER    Your procedure is scheduled on: 12/30/20   Report to [facility] Main  Entrance   Report to admitting at 7:35 AM     Call this number if you have problems the morning of surgery [place], NO CHEWING GUM [place].   No food after midnight.    You may have clear liquid until 7:30 AM.    At 7:00 AM drink pre surgery drink  . Nothing by mouth after 7:30 AM.   Take these medicines the morning of surgery with A SIP OF WATER: Clonazepam if needed, Venlafaxine, Levothyroxine  DO NOT TAKE ANY DIABETIC MEDICATIONS DAY OF YOUR SURGERY  How to Manage Your Diabetes Before and After Surgery  Why is it important to control my blood sugar before and after surgery? . Improving blood sugar levels before and after surgery helps healing and can limit problems. . A way of improving blood sugar control is eating a healthy diet by: o  Eating less sugar and carbohydrates o  Increasing activity/exercise o  Talking with your doctor about reaching your blood sugar goals . High blood sugars (greater than 180 mg/dL) can raise your risk of infections and slow your recovery, so you will need to focus on controlling your diabetes during the weeks before surgery. . Make sure that the doctor who  takes care of your diabetes knows about your planned surgery including the date and location.  How do I manage my blood sugar before surgery? . Check your blood sugar at least 4 times a day, starting 2 days before surgery, to make sure that the level is not too high or low. o Check your blood sugar the morning of your surgery when you wake up and every 2 hours until you get to the Short Stay unit. . If your blood sugar is less than 70 mg/dL, you will need to treat for low blood sugar: o Do not take insulin. o Treat a low blood sugar (less than 70 mg/dL) with  cup of clear juice (cranberry or apple), 4 glucose tablets, OR glucose gel. o Recheck blood sugar in 15 minutes after treatment (to make sure it is greater than 70 mg/dL). If your blood sugar is not greater than 70 mg/dL on recheck, call [phone] for further instructions. . Report your blood sugar to the short stay nurse when you get to Short Stay.  . If you are admitted to the hospital after surgery: o Your blood sugar will be checked by the staff and you will probably be given insulin after surgery (instead of oral diabetes medicines) to make sure you have good blood sugar levels. o The goal for  blood sugar control after surgery is 80-180 mg/dL.   WHAT DO I DO ABOUT MY DIABETES MEDICATION?  Marland Kitchen Do not take oral diabetes medicines (pills) the morning of surgery.                               You may not have any metal on your body including hair pins and              piercings  Do not wear jewelry, make-up, lotions, powders or perfumes, deodorant             Do not wear nail polish on your fingernails.  Do not shave  48 hours prior to surgery.              .   Do not bring valuables to the hospital. [place].  Contacts, dentures or bridgework may not be worn into surgery.                 Please read over the following fact sheets you were  given: _____________________________________________________________________             [facility] - Preparing for Surgery Before surgery, you can play an important role.  Because skin is not sterile, your skin needs to be as free of germs as possible.  You can reduce the number of germs on your skin by washing with CHG (chlorahexidine gluconate) soap before surgery.  CHG is an antiseptic cleaner which kills germs and bonds with the skin to continue killing germs even after washing. Please DO NOT use if you have an allergy to CHG or antibacterial soaps.  If your skin becomes reddened/irritated stop using the CHG and inform your nurse when you arrive at Short Stay. Do not shave (including legs and underarms) for at least 48 hours prior to the first CHG shower.    Please follow these instructions carefully:  1.  Shower with CHG Soap the night before surgery and the  morning of Surgery.  2.  If you choose to wash your hair, wash your hair first as usual with your  normal  shampoo.  3.  After you shampoo, rinse your hair and body thoroughly to remove the  shampoo.                                        4.  Use CHG as you would any other liquid soap.  You can apply chg directly  to the skin and wash                       Gently with a scrungie or clean washcloth.  5.  Apply the CHG Soap to your body ONLY FROM THE NECK DOWN.   Do not use on face/ open                           Wound or open sores. Avoid contact with eyes, ears mouth and genitals (private parts).                       Wash face,  Genitals (private parts) with your normal soap.  6.  Wash thoroughly, paying special attention to the area where your surgery  will be performed.  7.  Thoroughly rinse your body with warm water from the neck down.  8.  DO NOT shower/wash with your normal soap after using and rinsing off  the CHG Soap.             9.  Pat yourself dry with a clean towel.            10.  Wear clean pajamas.             11.  Place clean sheets on your bed the night of your first shower and do not  sleep with pets. Day of Surgery : Do not apply any lotions/deodorants the morning of surgery.  Please wear clean clothes to the hospital/surgery center.  FAILURE TO FOLLOW THESE INSTRUCTIONS MAY RESULT IN THE CANCELLATION OF YOUR SURGERY PATIENT SIGNATURE_________________________________  NURSE SIGNATURE__________________________________  ________________________________________________________________________   Adam Phenix  An incentive spirometer is a tool that can help keep your lungs clear and active. This tool measures how well you are filling your lungs with each breath. Taking long deep breaths may help reverse or decrease the chance of developing breathing (pulmonary) problems (especially infection) following:  A long period of time when you are unable to move or be active. BEFORE THE PROCEDURE   If the spirometer includes an indicator to show your best effort, your nurse or respiratory therapist will set it to a desired goal.  If possible, sit up straight or lean slightly forward. Try not to slouch.  Hold the incentive spirometer in an upright position. INSTRUCTIONS FOR USE  1. Sit on the edge of your bed if possible, or sit up as far as you can in bed or on a chair. 2. Hold the incentive spirometer in an upright position. 3. Breathe out normally. 4. Place the mouthpiece in your mouth and seal your lips tightly around it. 5. Breathe in slowly and as deeply as possible, raising the piston or the ball toward the top of the column. 6. Hold your breath for 3-5 seconds or for as long as possible. Allow the piston or ball to fall to the bottom of the column. 7. Remove the mouthpiece from your mouth and breathe out normally. 8. Rest for a few seconds and repeat Steps 1 through 7 at least 10 times every 1-2 hours when you are awake. Take your time and take a few normal breaths between deep  breaths. 9. The spirometer may include an indicator to show your best effort. Use the indicator as a goal to work toward during each repetition. 10. After each set of 10 deep breaths, practice coughing to be sure your lungs are clear. If you have an incision (the cut made at the time of surgery), support your incision when coughing by placing a pillow or rolled up towels firmly against it. Once you are able to get out of bed, walk around indoors and cough well. You may stop using the incentive spirometer when instructed by your caregiver.  RISKS AND COMPLICATIONS  Take your time so you do not get dizzy or light-headed.  If you are in pain, you may need to take or ask for pain medication before doing incentive spirometry. It is harder to take a deep breath if you are having pain. AFTER USE  Rest and breathe slowly and easily.  It can be helpful to keep track of a log of your progress. Your  caregiver can provide you with a simple table to help with this. If you are using the spirometer at home, follow these instructions: [place] IF:   You are having difficultly using the spirometer.  You have trouble using the spirometer as often as instructed.  Your pain medication is not giving enough relief while using the spirometer.  You develop fever of 100.5 F (38.1 C) or higher. SEEK IMMEDIATE MEDICAL CARE IF:   You cough up bloody sputum that had not been present before.  You develop fever of 102 F (38.9 C) or greater.  You develop worsening pain at or near the incision site. MAKE SURE YOU:   Understand these instructions.  Will watch your condition.  Will get help right away if you are not doing well or get worse. Document Released: 12/06/2006 Document Revised: 10/18/2011 Document Reviewed: 02/06/2007 [facility] Patient Information 2014 [place], [place].   ________________________________________________________________________

## 2020-12-25 ENCOUNTER — Encounter (HOSPITAL_COMMUNITY): Payer: Self-pay

## 2020-12-25 ENCOUNTER — Encounter (HOSPITAL_COMMUNITY)
Admission: RE | Admit: 2020-12-25 | Discharge: 2020-12-25 | Disposition: A | Discharge status: Home / Self Care | Payer: PPO | Source: Ambulatory Visit | Attending: Orthopedic Surgery | Admitting: Orthopedic Surgery

## 2020-12-25 ENCOUNTER — Other Ambulatory Visit: Payer: Self-pay

## 2020-12-25 DIAGNOSIS — Z01818 Encounter for other preprocedural examination: Secondary | ICD-10-CM | POA: Insufficient documentation

## 2020-12-25 HISTORY — DX: Other complications of anesthesia, initial encounter: T88.59XA

## 2020-12-25 LAB — SURGICAL PCR SCREEN
MRSA, PCR: NEGATIVE
Staphylococcus aureus: NEGATIVE

## 2020-12-25 LAB — GLUCOSE, CAPILLARY: Glucose-Capillary: 181 mg/dL — ABNORMAL HIGH (ref 70–99)

## 2020-12-25 NOTE — Progress Notes (Addendum)
COVID Vaccine Completed:yes Date COVID Vaccine completed:10/10/19, Booster 06/04/20, 12/18/20(moderna) COVID vaccine manufacturer: [place] [employer]     PCP - Dr.S. Daron Offer Cardiologist - no  Chest x-ray - no EKG -12/25/20-chart, epic  Stress Test - no ECHO - 2009 Cardiac Cath -no  Pacemaker/ICD device last checked:NA  Sleep Study - no CPAP -   Fasting Blood Sugar -  Has not been testing for a while.  Checks Blood Sugar _____ times a day  Blood Thinner Instructions:NA Aspirin Instructions: Last Dose:  Anesthesia review:   Patient denies shortness of breath, fever, cough and chest pain at PAT appointment Yes. No SOB with any activities.   Patient verbalized understanding of instructions that were given to them at the PAT appointment. Patient was also instructed that they will need to review over the PAT instructions again at home before surgery.Yes

## 2020-12-26 ENCOUNTER — Other Ambulatory Visit (HOSPITAL_COMMUNITY)
Admission: RE | Admit: 2020-12-26 | Discharge: 2020-12-26 | Disposition: A | Discharge status: Home / Self Care | Payer: PPO | Source: Ambulatory Visit | Attending: Orthopedic Surgery | Admitting: Orthopedic Surgery

## 2020-12-26 DIAGNOSIS — Z01812 Encounter for preprocedural laboratory examination: Secondary | ICD-10-CM | POA: Diagnosis not present

## 2020-12-26 DIAGNOSIS — Z20822 Contact with and (suspected) exposure to covid-19: Secondary | ICD-10-CM | POA: Diagnosis not present

## 2020-12-26 LAB — SARS CORONAVIRUS 2 (TAT 6-24 HRS): SARS Coronavirus 2: NEGATIVE

## 2020-12-29 NOTE — Anesthesia Preprocedure Evaluation (Addendum)
Anesthesia Evaluation  Patient identified by MRN, date of birth, ID band Patient awake    Reviewed: Allergy & Precautions, NPO status , Patient's Chart, lab work & pertinent test results  History of Anesthesia Complications Negative for: history of anesthetic complications  Airway Mallampati: II  TM Distance: >3 FB Neck ROM: Full    Dental no notable dental hx.    Pulmonary asthma , former smoker,    Pulmonary exam normal breath sounds clear to auscultation       Cardiovascular METS: 3 - Mets hypertension, Normal cardiovascular exam+ dysrhythmias (RBBB)  Rhythm:Regular Rate:Normal  EKG reviewed   Neuro/Psych negative neurological ROS  negative psych ROS   GI/Hepatic negative GI ROS, Neg liver ROS,   Endo/Other  diabetes, Type 1, Oral Hypoglycemic AgentsHypothyroidism   Renal/GU negative Renal ROS  negative genitourinary   Musculoskeletal  (+) Arthritis ,   Abdominal   Peds negative pediatric ROS (+)  Hematology negative hematology ROS (+)   Anesthesia Other Findings   Reproductive/Obstetrics                            Anesthesia Physical Anesthesia Plan  ASA: III  Anesthesia Plan: Spinal and Regional   Post-op Pain Management:  Regional for Post-op pain   Induction: Intravenous  PONV Risk Score and Plan: 2 and Propofol infusion, Treatment may vary due to age or medical condition, TIVA and Ondansetron  Airway Management Planned: Natural Airway and Simple Face Mask  Additional Equipment:   Intra-op Plan:   Post-operative Plan:   Informed Consent: I have reviewed the patients History and Physical, chart, labs and discussed the procedure including the risks, benefits and alternatives for the proposed anesthesia with the patient or authorized representative who has indicated his/her understanding and acceptance.     Dental advisory given  Plan Discussed with: CRNA,  Anesthesiologist and Surgeon  Anesthesia Plan Comments: (Spinal anesthetic. Adductor canal block for postop pain control. Propofol gtt. GA/LMA as backup plan. Norton Blizzard, MD  )       Anesthesia Quick Evaluation

## 2020-12-30 ENCOUNTER — Ambulatory Visit (HOSPITAL_COMMUNITY): Payer: PPO | Admitting: Anesthesiology

## 2020-12-30 ENCOUNTER — Ambulatory Visit (HOSPITAL_COMMUNITY)
Admission: RE | Admit: 2020-12-30 | Discharge: 2020-12-30 | Disposition: A | Discharge status: Home / Self Care | Payer: PPO | Attending: Orthopedic Surgery | Admitting: Orthopedic Surgery

## 2020-12-30 ENCOUNTER — Encounter (HOSPITAL_COMMUNITY)
Admission: RE | Disposition: A | Discharge status: Home / Self Care | Payer: Self-pay | Source: Home / Self Care | Attending: Orthopedic Surgery

## 2020-12-30 ENCOUNTER — Encounter (HOSPITAL_COMMUNITY): Payer: Self-pay | Admitting: Orthopedic Surgery

## 2020-12-30 DIAGNOSIS — Z87891 Personal history of nicotine dependence: Secondary | ICD-10-CM | POA: Diagnosis not present

## 2020-12-30 DIAGNOSIS — Z7984 Long term (current) use of oral hypoglycemic drugs: Secondary | ICD-10-CM | POA: Insufficient documentation

## 2020-12-30 DIAGNOSIS — E7800 Pure hypercholesterolemia, unspecified: Secondary | ICD-10-CM | POA: Diagnosis not present

## 2020-12-30 DIAGNOSIS — M1712 Unilateral primary osteoarthritis, left knee: Secondary | ICD-10-CM | POA: Diagnosis not present

## 2020-12-30 DIAGNOSIS — Z888 Allergy status to other drugs, medicaments and biological substances status: Secondary | ICD-10-CM | POA: Diagnosis not present

## 2020-12-30 DIAGNOSIS — I10 Essential (primary) hypertension: Secondary | ICD-10-CM | POA: Diagnosis not present

## 2020-12-30 DIAGNOSIS — G8918 Other acute postprocedural pain: Secondary | ICD-10-CM | POA: Diagnosis not present

## 2020-12-30 DIAGNOSIS — Z79899 Other long term (current) drug therapy: Secondary | ICD-10-CM | POA: Diagnosis not present

## 2020-12-30 DIAGNOSIS — Z96652 Presence of left artificial knee joint: Secondary | ICD-10-CM

## 2020-12-30 DIAGNOSIS — Z79890 Hormone replacement therapy (postmenopausal): Secondary | ICD-10-CM | POA: Diagnosis not present

## 2020-12-30 DIAGNOSIS — Z853 Personal history of malignant neoplasm of breast: Secondary | ICD-10-CM | POA: Diagnosis not present

## 2020-12-30 HISTORY — PX: TOTAL KNEE ARTHROPLASTY: SHX125

## 2020-12-30 LAB — ABO/RH: ABO/RH(D): O POS

## 2020-12-30 LAB — TYPE AND SCREEN
ABO/RH(D): O POS
Antibody Screen: NEGATIVE

## 2020-12-30 LAB — GLUCOSE, CAPILLARY
Glucose-Capillary: 189 mg/dL — ABNORMAL HIGH (ref 70–99)
Glucose-Capillary: 194 mg/dL — ABNORMAL HIGH (ref 70–99)

## 2020-12-30 SURGERY — ARTHROPLASTY, KNEE, TOTAL
Anesthesia: Regional | Site: Knee | Laterality: Left

## 2020-12-30 MED ORDER — FENTANYL CITRATE (PF) 100 MCG/2ML IJ SOLN
INTRAMUSCULAR | Status: AC
  Filled 2020-12-30: qty 2

## 2020-12-30 MED ORDER — LACTATED RINGERS IV SOLN: INTRAVENOUS | Status: DC

## 2020-12-30 MED ORDER — PROPOFOL 10 MG/ML IV BOLUS
INTRAVENOUS | Status: DC | PRN
  Administered 2020-12-30: 30 mg via INTRAVENOUS
  Administered 2020-12-30: 150 mg via INTRAVENOUS
  Administered 2020-12-30: 20 mg via INTRAVENOUS
  Administered 2020-12-30: 40 mg via INTRAVENOUS
  Administered 2020-12-30: 20 mg via INTRAVENOUS

## 2020-12-30 MED ORDER — ACETAMINOPHEN 500 MG PO TABS
1000.0000 mg | ORAL_TABLET | Freq: Once | ORAL | Status: AC
  Administered 2020-12-30: 1000 mg via ORAL
  Filled 2020-12-30: qty 2

## 2020-12-30 MED ORDER — BUPIVACAINE-EPINEPHRINE (PF) 0.25% -1:200000 IJ SOLN
INTRAMUSCULAR | Status: AC
  Filled 2020-12-30: qty 30

## 2020-12-30 MED ORDER — ONDANSETRON HCL 4 MG/2ML IJ SOLN
INTRAMUSCULAR | Status: DC | PRN
  Administered 2020-12-30: 4 mg via INTRAVENOUS

## 2020-12-30 MED ORDER — FENTANYL CITRATE (PF) 100 MCG/2ML IJ SOLN: 25.0000 ug | INTRAMUSCULAR | Status: DC | PRN

## 2020-12-30 MED ORDER — MIDAZOLAM HCL 2 MG/2ML IJ SOLN
1.0000 mg | INTRAMUSCULAR | Status: DC
Start: 2020-12-30 — End: 2020-12-30
  Administered 2020-12-30: 1 mg via INTRAVENOUS
  Filled 2020-12-30: qty 2

## 2020-12-30 MED ORDER — KETOROLAC TROMETHAMINE 30 MG/ML IJ SOLN
INTRAMUSCULAR | Status: DC | PRN
  Administered 2020-12-30: 30 mg

## 2020-12-30 MED ORDER — OXYCODONE HCL 5 MG PO TABS: 5.0000 mg | ORAL_TABLET | Freq: Four times a day (QID) | ORAL | 0 refills | Status: DC | PRN

## 2020-12-30 MED ORDER — OXYCODONE HCL 5 MG/5ML PO SOLN: 5.0000 mg | Freq: Once | ORAL | Status: AC | PRN

## 2020-12-30 MED ORDER — EPHEDRINE 5 MG/ML INJ
INTRAVENOUS | Status: AC
  Filled 2020-12-30: qty 10

## 2020-12-30 MED ORDER — FENTANYL CITRATE (PF) 250 MCG/5ML IJ SOLN
INTRAMUSCULAR | Status: AC
  Filled 2020-12-30: qty 5

## 2020-12-30 MED ORDER — CEFAZOLIN SODIUM-DEXTROSE 2-4 GM/100ML-% IV SOLN
2.0000 g | Freq: Four times a day (QID) | INTRAVENOUS | Status: DC
  Administered 2020-12-30: 2 g via INTRAVENOUS

## 2020-12-30 MED ORDER — CEFAZOLIN SODIUM-DEXTROSE 2-3 GM-%(50ML) IV SOLR
INTRAVENOUS | Status: DC | PRN
  Administered 2020-12-30: 2 g via INTRAVENOUS

## 2020-12-30 MED ORDER — TRANEXAMIC ACID-NACL 1000-0.7 MG/100ML-% IV SOLN
INTRAVENOUS | Status: DC | PRN
  Administered 2020-12-30: 1000 mg via INTRAVENOUS

## 2020-12-30 MED ORDER — POLYETHYLENE GLYCOL 3350 17 G PO PACK: 17.0000 g | PACK | Freq: Two times a day (BID) | ORAL | 0 refills | Status: DC

## 2020-12-30 MED ORDER — LACTATED RINGERS IV BOLUS
250.0000 mL | Freq: Once | INTRAVENOUS | Status: AC
  Administered 2020-12-30: 250 mL via INTRAVENOUS

## 2020-12-30 MED ORDER — ONDANSETRON HCL 4 MG/2ML IJ SOLN
INTRAMUSCULAR | Status: AC
  Filled 2020-12-30: qty 2

## 2020-12-30 MED ORDER — TRANEXAMIC ACID-NACL 1000-0.7 MG/100ML-% IV SOLN
INTRAVENOUS | Status: AC
  Filled 2020-12-30: qty 100

## 2020-12-30 MED ORDER — KETAMINE HCL 10 MG/ML IJ SOLN
INTRAMUSCULAR | Status: AC
  Filled 2020-12-30: qty 1

## 2020-12-30 MED ORDER — KETOROLAC TROMETHAMINE 30 MG/ML IJ SOLN
INTRAMUSCULAR | Status: AC
  Filled 2020-12-30: qty 1

## 2020-12-30 MED ORDER — BUPIVACAINE HCL (PF) 0.5 % IJ SOLN
INTRAMUSCULAR | Status: DC | PRN
  Administered 2020-12-30: 20 mL via PERINEURAL

## 2020-12-30 MED ORDER — OXYCODONE HCL 5 MG PO TABS
5.0000 mg | ORAL_TABLET | Freq: Once | ORAL | Status: AC | PRN
  Administered 2020-12-30: 5 mg via ORAL

## 2020-12-30 MED ORDER — CHLORHEXIDINE GLUCONATE 0.12 % MT SOLN
15.0000 mL | Freq: Once | OROMUCOSAL | Status: AC
  Administered 2020-12-30: 15 mL via OROMUCOSAL

## 2020-12-30 MED ORDER — TRANEXAMIC ACID-NACL 1000-0.7 MG/100ML-% IV SOLN
1000.0000 mg | Freq: Once | INTRAVENOUS | Status: AC
  Administered 2020-12-30: 1000 mg via INTRAVENOUS

## 2020-12-30 MED ORDER — CEFAZOLIN SODIUM-DEXTROSE 2-4 GM/100ML-% IV SOLN
INTRAVENOUS | Status: AC
  Filled 2020-12-30: qty 100

## 2020-12-30 MED ORDER — METHOCARBAMOL 500 MG IVPB - SIMPLE MED
INTRAVENOUS | Status: AC
  Filled 2020-12-30: qty 50

## 2020-12-30 MED ORDER — FENTANYL CITRATE (PF) 100 MCG/2ML IJ SOLN
50.0000 ug | INTRAMUSCULAR | Status: DC
  Administered 2020-12-30: 50 ug via INTRAVENOUS
  Filled 2020-12-30: qty 2

## 2020-12-30 MED ORDER — DEXAMETHASONE SODIUM PHOSPHATE 10 MG/ML IJ SOLN
INTRAMUSCULAR | Status: AC
  Filled 2020-12-30: qty 1

## 2020-12-30 MED ORDER — STERILE WATER FOR IRRIGATION IR SOLN
Status: DC | PRN
  Administered 2020-12-30: 2000 mL

## 2020-12-30 MED ORDER — DEXAMETHASONE SODIUM PHOSPHATE 10 MG/ML IJ SOLN
INTRAMUSCULAR | Status: DC | PRN
  Administered 2020-12-30: 10 mg via INTRAVENOUS

## 2020-12-30 MED ORDER — ASPIRIN 81 MG PO CHEW: 81.0000 mg | CHEWABLE_TABLET | Freq: Two times a day (BID) | ORAL | 0 refills | Status: AC

## 2020-12-30 MED ORDER — PROMETHAZINE HCL 25 MG/ML IJ SOLN: 6.2500 mg | INTRAMUSCULAR | Status: DC | PRN

## 2020-12-30 MED ORDER — HYDROMORPHONE HCL 2 MG/ML IJ SOLN
INTRAMUSCULAR | Status: AC
  Filled 2020-12-30: qty 1

## 2020-12-30 MED ORDER — BUPIVACAINE-EPINEPHRINE (PF) 0.25% -1:200000 IJ SOLN
INTRAMUSCULAR | Status: DC | PRN
  Administered 2020-12-30: 30 mL

## 2020-12-30 MED ORDER — 0.9 % SODIUM CHLORIDE (POUR BTL) OPTIME
TOPICAL | Status: DC | PRN
  Administered 2020-12-30: 1000 mL

## 2020-12-30 MED ORDER — PROPOFOL 500 MG/50ML IV EMUL
INTRAVENOUS | Status: DC | PRN
  Administered 2020-12-30: 25 ug/kg/min via INTRAVENOUS

## 2020-12-30 MED ORDER — SODIUM CHLORIDE 0.9 % IR SOLN
Status: DC | PRN
  Administered 2020-12-30: 1000 mL

## 2020-12-30 MED ORDER — PROPOFOL 1000 MG/100ML IV EMUL
INTRAVENOUS | Status: AC
  Filled 2020-12-30: qty 100

## 2020-12-30 MED ORDER — SODIUM CHLORIDE (PF) 0.9 % IJ SOLN
INTRAMUSCULAR | Status: DC | PRN
  Administered 2020-12-30: 30 mL

## 2020-12-30 MED ORDER — METHOCARBAMOL 500 MG PO TABS: 500.0000 mg | ORAL_TABLET | Freq: Four times a day (QID) | ORAL | Status: DC | PRN

## 2020-12-30 MED ORDER — ORAL CARE MOUTH RINSE: 15.0000 mL | Freq: Once | OROMUCOSAL | Status: AC

## 2020-12-30 MED ORDER — DOCUSATE SODIUM 100 MG PO CAPS: 100.0000 mg | ORAL_CAPSULE | Freq: Two times a day (BID) | ORAL | Status: DC

## 2020-12-30 MED ORDER — DROPERIDOL 2.5 MG/ML IJ SOLN: 0.6250 mg | Freq: Once | INTRAMUSCULAR | Status: DC | PRN

## 2020-12-30 MED ORDER — METHOCARBAMOL 500 MG IVPB - SIMPLE MED
500.0000 mg | Freq: Four times a day (QID) | INTRAVENOUS | Status: DC | PRN
  Administered 2020-12-30: 500 mg via INTRAVENOUS

## 2020-12-30 MED ORDER — OXYCODONE HCL 5 MG PO TABS
ORAL_TABLET | ORAL | Status: AC
  Filled 2020-12-30: qty 1

## 2020-12-30 MED ORDER — METHOCARBAMOL 500 MG PO TABS: 500.0000 mg | ORAL_TABLET | Freq: Four times a day (QID) | ORAL | 0 refills | Status: DC | PRN

## 2020-12-30 MED ORDER — LACTATED RINGERS IV BOLUS
500.0000 mL | Freq: Once | INTRAVENOUS | Status: AC
  Administered 2020-12-30: 500 mL via INTRAVENOUS

## 2020-12-30 MED ORDER — CELECOXIB 200 MG PO CAPS: 200.0000 mg | ORAL_CAPSULE | Freq: Two times a day (BID) | ORAL | 0 refills | Status: AC

## 2020-12-30 MED ORDER — FENTANYL CITRATE (PF) 250 MCG/5ML IJ SOLN
INTRAMUSCULAR | Status: DC | PRN
  Administered 2020-12-30: 25 ug via INTRAVENOUS
  Administered 2020-12-30: 75 ug via INTRAVENOUS
  Administered 2020-12-30 (×4): 50 ug via INTRAVENOUS

## 2020-12-30 MED ORDER — ACETAMINOPHEN 500 MG PO TABS: 1000.0000 mg | ORAL_TABLET | Freq: Three times a day (TID) | ORAL | 2 refills | Status: AC | PRN

## 2020-12-30 SURGICAL SUPPLY — 55 items
ADH SKN CLS APL DERMABOND .7 (GAUZE/BANDAGES/DRESSINGS) ×1
ATTUNE MED ANAT PAT 35 KNEE (Knees) ×1 IMPLANT
ATTUNE PSFEM LTSZ5 NARCEM KNEE (Femur) ×1 IMPLANT
ATTUNE PSRP INSR SZ5 6 KNEE (Insert) ×1 IMPLANT
BAG SPEC THK2 15X12 ZIP CLS (MISCELLANEOUS)
BAG ZIPLOCK 12X15 (MISCELLANEOUS) IMPLANT
BASE TIBIAL ROT PLAT SZ 5 KNEE (Knees) IMPLANT
BLADE SAW SGTL 11.0X1.19X90.0M (BLADE) ×1 IMPLANT
BLADE SAW SGTL 13.0X1.19X90.0M (BLADE) ×2 IMPLANT
BLADE SURG SZ10 CARB STEEL (BLADE) ×4 IMPLANT
BNDG ELASTIC 6X5.8 VLCR STR LF (GAUZE/BANDAGES/DRESSINGS) ×2 IMPLANT
BOWL SMART MIX CTS (DISPOSABLE) ×2 IMPLANT
BSPLAT TIB 5 CMNT ROT PLAT STR (Knees) ×1 IMPLANT
CEMENT HV SMART SET (Cement) ×2 IMPLANT
COVER WAND RF STERILE (DRAPES) ×1 IMPLANT
CUFF TOURN SGL QUICK 34 (TOURNIQUET CUFF) ×2
CUFF TRNQT CYL 34X4.125X (TOURNIQUET CUFF) ×1 IMPLANT
DECANTER SPIKE VIAL GLASS SM (MISCELLANEOUS) ×4 IMPLANT
DERMABOND ADVANCED (GAUZE/BANDAGES/DRESSINGS) ×1
DERMABOND ADVANCED .7 DNX12 (GAUZE/BANDAGES/DRESSINGS) ×1 IMPLANT
DRAPE U-SHAPE 47X51 STRL (DRAPES) ×2 IMPLANT
DRESSING AQUACEL AG SP 3.5X10 (GAUZE/BANDAGES/DRESSINGS) ×1 IMPLANT
DRSG AQUACEL AG SP 3.5X10 (GAUZE/BANDAGES/DRESSINGS) ×2
DURAPREP 26ML APPLICATOR (WOUND CARE) ×4 IMPLANT
ELECT REM PT RETURN 15FT ADLT (MISCELLANEOUS) ×2 IMPLANT
GLOVE SURG ENC MOIS LTX SZ6 (GLOVE) ×1 IMPLANT
GLOVE SURG UNDER LTX SZ7.5 (GLOVE) ×2 IMPLANT
GLOVE SURG UNDER POLY LF SZ6.5 (GLOVE) ×1 IMPLANT
GLOVE SURG UNDER POLY LF SZ7.5 (GLOVE) ×2 IMPLANT
GOWN STRL REUS W/TWL LRG LVL3 (GOWN DISPOSABLE) ×2 IMPLANT
HANDPIECE INTERPULSE COAX TIP (DISPOSABLE) ×2
HOLDER FOLEY CATH W/STRAP (MISCELLANEOUS) IMPLANT
KIT TURNOVER KIT A (KITS) ×2 IMPLANT
MANIFOLD NEPTUNE II (INSTRUMENTS) ×2 IMPLANT
NDL SAFETY ECLIPSE 18X1.5 (NEEDLE) IMPLANT
NEEDLE HYPO 18GX1.5 SHARP (NEEDLE) ×2
NS IRRIG 1000ML POUR BTL (IV SOLUTION) ×2 IMPLANT
PACK TOTAL KNEE CUSTOM (KITS) ×2 IMPLANT
PENCIL SMOKE EVACUATOR (MISCELLANEOUS) ×1 IMPLANT
PIN DRILL FIX HALF THREAD (BIT) ×1 IMPLANT
PIN STEINMAN FIXATION KNEE (PIN) ×1 IMPLANT
PROTECTOR NERVE ULNAR (MISCELLANEOUS) ×2 IMPLANT
SET HNDPC FAN SPRY TIP SCT (DISPOSABLE) ×1 IMPLANT
SET PAD KNEE POSITIONER (MISCELLANEOUS) ×2 IMPLANT
SUT MNCRL AB 4-0 PS2 18 (SUTURE) ×2 IMPLANT
SUT STRATAFIX PDS+ 0 24IN (SUTURE) ×2 IMPLANT
SUT VIC AB 1 CT1 36 (SUTURE) ×2 IMPLANT
SUT VIC AB 2-0 CT1 27 (SUTURE) ×6
SUT VIC AB 2-0 CT1 TAPERPNT 27 (SUTURE) ×3 IMPLANT
SYR 3ML LL SCALE MARK (SYRINGE) ×2 IMPLANT
TIBIAL BASE ROT PLAT SZ 5 KNEE (Knees) ×2 IMPLANT
TRAY FOLEY MTR SLVR 14FR STAT (SET/KITS/TRAYS/PACK) IMPLANT
TUBE SUCTION HIGH CAP CLEAR NV (SUCTIONS) ×2 IMPLANT
WATER STERILE IRR 1000ML POUR (IV SOLUTION) ×4 IMPLANT
WRAP KNEE MAXI GEL POST OP (GAUZE/BANDAGES/DRESSINGS) ×2 IMPLANT

## 2020-12-30 NOTE — Anesthesia Procedure Notes (Signed)
Anesthesia Regional Block: Adductor canal block   Pre-Anesthetic Checklist: ,, timeout performed, Correct Patient, Correct Site, Correct Laterality, Correct Procedure, Correct Position, site marked, Risks and benefits discussed,  Surgical consent,  Pre-op evaluation,  At surgeon's request and post-op pain management  Laterality: Left  Prep: chloraprep       Needles:  Injection technique: Single-shot  Needle Type: Echogenic Stimulator Needle     Needle Length: 10cm  Needle Gauge: 20     Additional Needles:   Procedures:,,,, ultrasound used (permanent image in chart),,,,  Narrative:  Start time: 12/30/2020 8:50 AM End time: 12/30/2020 8:55 AM Injection made incrementally with aspirations every 5 mL.  Performed by: Personally  Anesthesiologist: Merlinda Frederick, MD  Additional Notes: A functioning IV was confirmed and monitors were applied.  Sterile prep and drape, hand hygiene and sterile gloves were used.  Negative aspiration and test dose prior to incremental administration of local anesthetic. The patient tolerated the procedure well.Ultrasound  guidance: relevant anatomy identified, needle position confirmed, local anesthetic spread visualized around nerve(s), vascular puncture avoided.  Image printed for medical record.

## 2020-12-30 NOTE — Anesthesia Procedure Notes (Signed)
Procedure Name: LMA Insertion Date/Time: 12/30/2020 10:24 AM Performed by: Claudia Desanctis, CRNA Pre-anesthesia Checklist: Emergency Drugs available, Patient identified, Suction available and Patient being monitored Patient Re-evaluated:Patient Re-evaluated prior to induction Oxygen Delivery Method: Circle system utilized Preoxygenation: Pre-oxygenation with 100% oxygen Induction Type: IV induction Ventilation: Mask ventilation without difficulty LMA: LMA inserted LMA Size: 4.0 Number of attempts: 1 Placement Confirmation: positive ETCO2 and breath sounds checked- equal and bilateral Tube secured with: Tape Dental Injury: Teeth and Oropharynx as per pre-operative assessment

## 2020-12-30 NOTE — Discharge Instructions (Signed)

## 2020-12-30 NOTE — Op Note (Signed)
NAME:  Christina Jensen                      MEDICAL RECORD NO.:  [id number]                             FACILITY:  [facility]      PHYSICIAN:  Pietro Cassis. Alvan Dame, M.D.  DATE OF BIRTH:  [birth year]      DATE OF PROCEDURE:  12/30/2020                                     OPERATIVE REPORT         PREOPERATIVE DIAGNOSIS:  Left knee osteoarthritis.      POSTOPERATIVE DIAGNOSIS:  Left knee osteoarthritis.      FINDINGS:  The patient was noted to have complete loss of cartilage and   bone-on-bone arthritis with associated osteophytes in the medial and lateral compartments of   the knee with a significant synovitis and associated effusion.  The patient had failed months of conservative treatment including medications, injection therapy, activity modification.     PROCEDURE:  Left total knee replacement.      COMPONENTS USED:  DePuy Attune rotating platform posterior stabilized knee   system, a size 5N femur, 5 tibia, size 6 mm PS AOX insert, and 35 anatomic patellar   button.      SURGEON:  Pietro Cassis. Alvan Dame, M.D.      ASSISTANT:  Griffith Citron, PA-C.      ANESTHESIA:  Regional and Spinal.      SPECIMENS:  None.      COMPLICATION:  None.      DRAINS:  None.  EBL: <100 cc      TOURNIQUET TIME:  29 min at 250 mmHg   The patient was stable to the recovery room.      INDICATION FOR PROCEDURE:  Christina Jensen is a 70 y.o. female patient of   mine.  The patient had been seen, evaluated, and treated for months conservatively in the   office with medication, activity modification, and injections.  The patient had   radiographic changes of bone-on-bone arthritis with endplate sclerosis and osteophytes noted.  Based on the radiographic changes and failed conservative measures, the patient   decided to proceed with definitive treatment, total knee replacement.  Risks of infection, DVT, component failure, need for revision surgery, neurovascular injury were reviewed in the office setting.  The postop  course was reviewed stressing the efforts to maximize post-operative satisfaction and function.  Consent was obtained for benefit of pain   relief.      PROCEDURE IN DETAIL:  The patient was brought to the operative theater.   Once adequate anesthesia, preoperative antibiotics, 2 gm of Ancef,1 gm of Tranexamic Acid, and 10 mg of Decadron administered, the patient was positioned supine with a left thigh tourniquet placed.  The  left lower extremity was prepped and draped in sterile fashion.  A time-   out was performed identifying the patient, planned procedure, and the appropriate extremity.      The left lower extremity was placed in the [facility] leg holder.  The leg was   exsanguinated, tourniquet elevated to 250 mmHg.  A midline incision was   made followed by median parapatellar arthrotomy.  Following initial   exposure, attention was first directed  to the patella.  Precut   measurement was noted to be 23 mm.  I resected down to 13-14 mm and used a   35 anatomic patellar button to restore patellar height as well as cover the cut surface.      The lug holes were drilled and a metal shim was placed to protect the   patella from retractors and saw blade during the procedure.      At this point, attention was now directed to the femur.  The femoral   canal was opened with a drill, irrigated to try to prevent fat emboli.  An   intramedullary rod was passed at 3 degrees valgus, 9 mm of bone was   resected off the distal femur.  Following this resection, the tibia was   subluxated anteriorly.  Using the extramedullary guide, 2 mm of bone was resected off   the proximal medial tibia.  We confirmed the gap would be   stable medially and laterally with a size 5 spacer block as well as confirmed that the tibial cut was perpendicular in the coronal plane, checking with an alignment rod.      Once this was done, I sized the femur to be a size 5 in the anterior-   posterior dimension, chose a narrow  component based on medial and   lateral dimension.  The size 5 rotation block was then pinned in   position anterior referenced using the C-clamp to set rotation.  The   anterior, posterior, and  chamfer cuts were made without difficulty nor   notching making certain that I was along the anterior cortex to help   with flexion gap stability.      The final box cut was made off the lateral aspect of distal femur.      At this point, the tibia was sized to be a size 5.  The size 5 tray was   then pinned in position through the medial third of the tubercle,   drilled, and keel punched.  Trial reduction was now carried with a 5 femur,  5 tibia, a size 6 mm PS insert, and the 35 anatomic patella botton.  The knee was brought to full extension with good flexion stability with the patella   tracking through the trochlea without application of pressure.  Given   all these findings the trial components removed.  Final components were   opened and cement was mixed.  The knee was irrigated with normal saline solution and pulse lavage.  The synovial lining was   then injected with 30 cc of 0.25% Marcaine with epinephrine, 1 cc of Toradol and 30 cc of NS for a total of 61 cc.     Final implants were then cemented onto cleaned and dried cut surfaces of bone with the knee brought to extension with a size 6 mm PS trial insert.      Once the cement had fully cured, excess cement was removed   throughout the knee.  I confirmed that I was satisfied with the range of   motion and stability, and the final size 6 mm PS AOX insert was chosen.  It was   placed into the knee.      The tourniquet had been let down at 29 minutes.  No significant   hemostasis was required.  The extensor mechanism was then reapproximated using #1 Vicryl and #1 Stratafix sutures with the knee   in flexion.  The   remaining  wound was closed with 2-0 Vicryl and running 4-0 Monocryl.   The knee was cleaned, dried, dressed sterilely using  Dermabond and   Aquacel dressing.  The patient was then   brought to recovery room in stable condition, tolerating the procedure   well.   Please note that Physician Assistant, Griffith Citron, PA-C was present for the entirety of the case, and was utilized for pre-operative positioning, peri-operative retractor management, general facilitation of the procedure and for primary wound closure at the end of the case.              Pietro Cassis Alvan Dame, M.D.    12/30/2020 10:11 AM

## 2020-12-30 NOTE — H&P (Signed)
TOTAL KNEE ADMISSION H&P  Patient is being admitted for left total knee arthroplasty.  Subjective:  Chief Complaint:left knee pain.  HPI: Christina Jensen, 70 y.o. female, has a history of pain and functional disability in the left knee due to arthritis and has failed non-surgical conservative treatments for greater than 12 weeks to includeNSAID's and/or analgesics, corticosteriod injections and activity modification.  Onset of symptoms was gradual, starting 2 years ago with gradually worsening course since that time. The patient noted no past surgery on the left knee(s).  Patient currently rates pain in the left knee(s) at 8 out of 10 with activity. Patient has worsening of pain with activity and weight bearing, pain that interferes with activities of daily living and pain with passive range of motion.  Patient has evidence of joint space narrowing by imaging studies.  There is no active infection.  Patient Active Problem List   Diagnosis Date Noted  . Cancer ([place])   . RBBB   . Tachycardia   . Palpitations   . Osteoarthritis    Past Medical History:  Diagnosis Date  . Asthma   . Breast cancer ([place]) 2002  . Cancer ([place]) 2002   breast,lumpectomy  . Complication of anesthesia   . Diabetes mellitus   . Diabetes mellitus without complication ([place])    type 1  . High cholesterol   . Hyperlipidemia   . Hypertension   . Hypothyroid   . Osteoarthritis   . Palpitations    chronic  . RBBB    chronic  . Tachycardia    H/O  . Thyroid disease    hypo    Past Surgical History:  Procedure Laterality Date  . BACK SURGERY  1992  . BREAST LUMPECTOMY Right 2002  . BREAST LUMPECTOMY     2002  . cataracts     both eyes  . childbirth     x1    No current facility-administered medications for this encounter.   Current Outpatient Medications  Medication Sig Dispense Refill Last Dose  . B Complex-C (SUPER B COMPLEX PO) Take 1 tablet by mouth every evening.     . cholecalciferol (VITAMIN  D) 1000 UNITS tablet Take 1,000 Units by mouth every evening.      . clonazePAM (KLONOPIN) 1 MG tablet Take 1 mg by mouth at bedtime.     Marland Kitchen glipiZIDE (GLUCOTROL) 5 MG tablet Take 5 mg by mouth 2 (two) times daily before a meal.     . levothyroxine (SYNTHROID) 112 MCG tablet Take 112 mcg by mouth daily before breakfast.     . losartan (COZAAR) 50 MG tablet Take 50 mg by mouth daily.     . Magnesium 250 MG TABS Take 500 mg by mouth daily.     . Multiple Vitamin (MULTIVITAMIN) tablet Take 1 tablet by mouth every morning.      . rosuvastatin (CRESTOR) 10 MG tablet Take 10 mg by mouth daily.     Marland Kitchen venlafaxine XR (EFFEXOR-XR) 75 MG 24 hr capsule Take 75 mg by mouth daily with breakfast.     . vitamin E 180 MG (400 UNITS) capsule Take 800 Units by mouth daily.      Allergies  Allergen Reactions  . Simvastatin Itching and Nausea And Vomiting    Social History   Tobacco Use  . Smoking status: Former Smoker    Quit date: 1980    Years since quitting: 42.4  . Smokeless tobacco: Never Used  Substance Use Topics  .  Alcohol use: Yes    Comment: occasionally wine    Family History  Problem Relation Age of Onset  . Heart disease Mother      Review of Systems  Constitutional: Negative for chills and fever.  Respiratory: Negative for cough and shortness of breath.   Cardiovascular: Negative for chest pain.  Gastrointestinal: Negative for nausea and vomiting.  Musculoskeletal: Positive for arthralgias.    Objective:  Physical Exam Well nourished and well developed. General: Alert and oriented x3, cooperative and pleasant, no acute distress. Head: normocephalic, atraumatic, neck supple. Eyes: EOMI.  Musculoskeletal: Left knee exam: No palpable effusion, warmth erythema. Full knee extension and flexion with tenderness mainly over the anterior and medial aspect of the joint. Mild crepitation. Stable medial lateral collateral ligaments Decreased tenderness in the gluteal region No  significant pain with passive extension  Calves soft and nontender. Motor function intact in LE. Strength 5/5 LE bilaterally. Neuro: Distal pulses 2+. Sensation to light touch intact in LE. Vital signs in last 24 hours:    Labs:   Estimated body mass index is 27.57 kg/m as calculated from the following:   Height as of 12/25/20: 5\' 7"  (1.702 m).   Weight as of 12/25/20: 79.8 kg.   Imaging Review Plain radiographs demonstrate severe degenerative joint disease of the left knee(s). The overall alignment isneutral. The bone quality appears to be adequate for age and reported activity level.  Assessment/Plan:  End stage arthritis, left knee   The patient history, physical examination, clinical judgment of the provider and imaging studies are consistent with end stage degenerative joint disease of the left knee(s) and total knee arthroplasty is deemed medically necessary. The treatment options including medical management, injection therapy arthroscopy and arthroplasty were discussed at length. The risks and benefits of total knee arthroplasty were presented and reviewed. The risks due to aseptic loosening, infection, stiffness, patella tracking problems, thromboembolic complications and other imponderables were discussed. The patient acknowledged the explanation, agreed to proceed with the plan and consent was signed. Patient is being admitted for inpatient treatment for surgery, pain control, PT, OT, prophylactic antibiotics, VTE prophylaxis, progressive ambulation and ADL's and discharge planning. The patient is planning to be discharged home.  Therapy Plans: outpatient therapy at [facility] Disposition: Home with husband Planned DVT Prophylaxis: aspirin 81mg  BID DME needed: none PCP: Dr. Terrill Mohr, clearance received TXA: IV Allergies: statins - hives Anesthesia Concerns: none BMI: 27..9 Last HgbA1c: 6.9% on 5/12  Other: - Planning on SDD -- - Hx of breast cancer 20 years ago,  s/p right lumpectomy - Hydrocodone is okay  Patient's anticipated LOS is less than 2 midnights, meeting these requirements: - Lives within 1 hour of care - Has a competent adult at home to recover with post-op recover - NO history of  - Chronic pain requiring opiods  - Diabetes  - Coronary Artery Disease  - Heart failure  - Heart attack  - Stroke  - DVT/VTE  - Cardiac arrhythmia  - Respiratory Failure/COPD  - Renal failure  - Anemia  - Advanced Liver disease   Griffith Citron, PA-C Orthopedic Surgery [facility] [place] [phone]

## 2020-12-30 NOTE — Progress Notes (Signed)
AssistedDr. Elgie Congo with left, ultrasound guided, adductor canal block. Side rails up, monitors on throughout procedure. See vital signs in flow sheet. Tolerated Procedure well.

## 2020-12-30 NOTE — Evaluation (Signed)
Physical Therapy Evaluation Patient Details Name: Christina Jensen MRN: [id number] DOB: January 10, 1951 Today's Date: 12/30/2020   History of Present Illness  pt admit for LTKA 12/30/2020, and orders to DC from PACU.  Clinical Impression  Pt s/p LTKA presented with weakness and decreased mobility in PACU. Educated with knee safety position, progression exercises, ambulation with RW, car transfer, ice education,  and stair training. First time up after stair training, pt got diaphoretic, however after 30 minute rest interval pt then ambulated again with no signs and sympotms. Toelrated well, ready to DC home with f/u at [facility] for progression.     Follow Up Recommendations  pt heading to OPPT already set up.     Equipment Recommendations    none    Recommendations for Other Services       Precautions / Restrictions Precautions Precautions: Knee Precaution Booklet Issued: No Precaution Comments: educated and described correct positioning to avoid resting with flexed knee in supine and during the day. Educated with moving around periodically during the day to decrease pain dn continue with ROM and strengthening. Restrictions Weight Bearing Restrictions: No      Mobility  Bed Mobility Overal bed mobility: Modified Independent                  Transfers Overall transfer level: Needs assistance Equipment used: Rolling walker (2 wheeled) Transfers: Sit to/from Stand Sit to Stand: Min guard         General transfer comment: performed several times for [facility] and transfers for gait and stair training. cues for RW safety and hand placement.  Ambulation/Gait Ambulation/Gait assistance: Min guard Gait Distance (Feet): 20 Feet (then second time 60 feet) Assistive device: Rolling walker (2 wheeled) Gait Pattern/deviations: Step-to pattern     General Gait Details: educated with step tp pattern for safety to prevent buckling of LLE due to still not completely "awake" with muscle control.  First time pt did get diaphoretic and needed to sit. After rest break about 30 minutes , pt then was able to ambulate with no complications of diaphoretic.  Stairs Stairs: Yes Stairs assistance: Min assist Stair Management: One rail Left;Step to pattern;Forwards Number of Stairs: 3 General stair comments: pt faced sideways and was able to complet going up and down steps with minA .  Wheelchair Mobility    Modified Rankin (Stroke Patients Only)       Balance Overall balance assessment: Needs assistance Sitting-balance support: Bilateral upper extremity supported;Feet supported Sitting balance-Leahy Scale: Good     Standing balance support: Bilateral upper extremity supported;During functional activity Standing balance-Leahy Scale: Fair                               Pertinent Vitals/Pain Pain Assessment: 0-10 Pain Score: 5  Pain Location: L knee anteriorly and proximal to knee in distal quad area Pain Descriptors / Indicators: Aching;Sore Pain Intervention(s): Limited activity within patient's tolerance;Monitored during session    [place] expects to be discharged to:: Private residence Living Arrangements: Spouse/significant other Available Help at Discharge: Family Type of Home: House Home Access: Stairs to enter Entrance Stairs-Rails: Left Entrance Stairs-Number of Steps: 3-4 Home Layout: One level Home Equipment: Walker - 2 wheels;Walker - 4 wheels;Cane - single point      Prior Function Level of Independence: Independent               Hand Dominance  Extremity/Trunk Assessment        Lower Extremity Assessment Lower Extremity Assessment: LLE deficits/detail;Overall WFL for tasks assessed LLE Deficits / Details: AAROM supine grossly -10 from extension and limited with knee flexion 70 degrees, able to SLR with little asssitance 3/5 quad       Communication   Communication: No difficulties  Cognition  Arousal/Alertness: Awake/alert Behavior During Therapy: WFL for tasks assessed/performed Overall Cognitive Status: Within Functional Limits for tasks assessed                                        General Comments      Exercises Total Joint Exercises Ankle Circles/Pumps: AROM;Both;10 reps Quad Sets: Left;AROM;5 reps Heel Slides: AAROM;Left;5 reps;Supine Hip ABduction/ADduction: AAROM;Left;5 reps;Supine Straight Leg Raises: AAROM;Left;5 reps;Supine Long Arc Quad: AAROM;Left;5 reps;Supine Goniometric ROM: 10-70   Assessment/Plan    PT Assessment Patent does not need any further PT services  PT Problem List Decreased strength;Decreased mobility;Decreased range of motion;Decreased activity tolerance;Decreased balance       PT Treatment Interventions      PT Goals (Current goals can be found in the Care Plan section)  Acute Rehab PT Goals Patient Stated Goal: pt's goal of session was to be able to complete ambulation, stair and exercises training in order to DC home safely from PACU with husband assisting PT Goal Formulation: All assessment and education complete, DC therapy    Frequency     Barriers to discharge        Co-evaluation               AM-PAC PT "6 Clicks" Mobility  Outcome Measure Help needed turning from your back to your side while in a flat bed without using bedrails?: None Help needed moving from lying on your back to sitting on the side of a flat bed without using bedrails?: None Help needed moving to and from a bed to a chair (including a wheelchair)?: A Little Help needed standing up from a chair using your arms (e.g., wheelchair or bedside chair)?: A Little Help needed to walk in hospital room?: A Little Help needed climbing 3-5 steps with a railing? : A Little 6 Click Score: 20    End of Session Equipment Utilized During Treatment: Gait belt Activity Tolerance: Patient tolerated treatment well Patient left: in bed;with call  bell/phone within reach;with nursing/sitter in room Nurse Communication: Mobility status PT Visit Diagnosis: Other abnormalities of gait and mobility (R26.89)    Time: 6962-9528 PT Time Calculation (min) (ACUTE ONLY): 60 min   Charges:   PT Evaluation $PT Eval Low Complexity: 1 Low PT Treatments $Gait Training: 23-37 mins $Therapeutic Exercise: 8-22 mins        Kellsey Sansone, PT, MPT Acute Rehabilitation Services Office: [phone] Pager: [phone] 12/30/2020   Clide Dales 12/30/2020, 9:49 PM

## 2020-12-30 NOTE — Transfer of Care (Signed)
Immediate Anesthesia Transfer of Care Note  Patient: Christina Jensen  Procedure(s) Performed: TOTAL KNEE ARTHROPLASTY (Left Knee)  Patient Location: PACU  Anesthesia Type:General  Level of Consciousness: awake and patient cooperative  Airway & Oxygen Therapy: Patient Spontanous Breathing and Patient connected to face mask  Post-op Assessment: Report given to RN and Post -op Vital signs reviewed and stable  Post vital signs: Reviewed and stable  Last Vitals:  Vitals Value Taken Time  BP 141/90 12/30/20 1203  Temp    Pulse 109 12/30/20 1207  Resp 6 12/30/20 1207  SpO2 94 % 12/30/20 1207  Vitals shown include unvalidated device data.  Last Pain:  Vitals:   12/30/20 0854  TempSrc:   PainSc: 0-No pain      Patients Stated Pain Goal: 1 (53/69/22 3009)  Complications: No complications documented.

## 2020-12-30 NOTE — Interval H&P Note (Signed)
History and Physical Interval Note:  12/30/2020 8:42 AM  Christina Jensen  has presented today for surgery, with the diagnosis of Left knee osteoarthritis.  The various methods of treatment have been discussed with the patient and family. After consideration of risks, benefits and other options for treatment, the patient has consented to  Procedure(s) with comments: TOTAL KNEE ARTHROPLASTY (Left) - 70 mins as a surgical intervention.  The patient's history has been reviewed, patient examined, no change in status, stable for surgery.  I have reviewed the patient's chart and labs.  Questions were answered to the patient's satisfaction.     Mauri Pole

## 2020-12-31 ENCOUNTER — Encounter (HOSPITAL_COMMUNITY): Payer: Self-pay | Admitting: Orthopedic Surgery

## 2021-01-01 NOTE — Anesthesia Postprocedure Evaluation (Signed)
Anesthesia Post Note  Patient: Christina Jensen  Procedure(s) Performed: TOTAL KNEE ARTHROPLASTY (Left Knee)     Patient location during evaluation: PACU Anesthesia Type: Regional and General Level of consciousness: awake and alert Pain management: pain level controlled Vital Signs Assessment: post-procedure vital signs reviewed and stable Respiratory status: spontaneous breathing, nonlabored ventilation, respiratory function stable and patient connected to nasal cannula oxygen Cardiovascular status: blood pressure returned to baseline and stable Postop Assessment: no apparent nausea or vomiting Anesthetic complications: no   No complications documented.  Last Vitals:  Vitals:   12/30/20 1700 12/30/20 1800  BP: (!) 142/88 128/81  Pulse:    Resp:    Temp:  36.6 C  SpO2: 96% 100%    Last Pain:  Vitals:   12/30/20 1800  TempSrc:   PainSc: 0-No pain   Pain Goal: Patients Stated Pain Goal: 1 (12/30/20 0805)                 Merlinda Frederick

## 2021-01-02 DIAGNOSIS — M25562 Pain in left knee: Secondary | ICD-10-CM | POA: Diagnosis not present

## 2021-01-06 DIAGNOSIS — M25562 Pain in left knee: Secondary | ICD-10-CM | POA: Diagnosis not present

## 2021-01-08 DIAGNOSIS — M546 Pain in thoracic spine: Secondary | ICD-10-CM | POA: Diagnosis not present

## 2021-01-08 DIAGNOSIS — M9902 Segmental and somatic dysfunction of thoracic region: Secondary | ICD-10-CM | POA: Diagnosis not present

## 2021-01-08 DIAGNOSIS — M542 Cervicalgia: Secondary | ICD-10-CM | POA: Diagnosis not present

## 2021-01-08 DIAGNOSIS — M9901 Segmental and somatic dysfunction of cervical region: Secondary | ICD-10-CM | POA: Diagnosis not present

## 2021-01-12 DIAGNOSIS — M542 Cervicalgia: Secondary | ICD-10-CM | POA: Diagnosis not present

## 2021-01-12 DIAGNOSIS — M9901 Segmental and somatic dysfunction of cervical region: Secondary | ICD-10-CM | POA: Diagnosis not present

## 2021-01-12 DIAGNOSIS — M25562 Pain in left knee: Secondary | ICD-10-CM | POA: Diagnosis not present

## 2021-01-12 DIAGNOSIS — M9902 Segmental and somatic dysfunction of thoracic region: Secondary | ICD-10-CM | POA: Diagnosis not present

## 2021-01-12 DIAGNOSIS — M546 Pain in thoracic spine: Secondary | ICD-10-CM | POA: Diagnosis not present

## 2021-01-13 DIAGNOSIS — D72824 Basophilia: Secondary | ICD-10-CM | POA: Diagnosis not present

## 2021-01-14 DIAGNOSIS — M25562 Pain in left knee: Secondary | ICD-10-CM | POA: Diagnosis not present

## 2021-01-16 DIAGNOSIS — M25562 Pain in left knee: Secondary | ICD-10-CM | POA: Diagnosis not present

## 2021-01-19 DIAGNOSIS — M25562 Pain in left knee: Secondary | ICD-10-CM | POA: Diagnosis not present

## 2021-01-19 DIAGNOSIS — M542 Cervicalgia: Secondary | ICD-10-CM | POA: Diagnosis not present

## 2021-01-19 DIAGNOSIS — M9901 Segmental and somatic dysfunction of cervical region: Secondary | ICD-10-CM | POA: Diagnosis not present

## 2021-01-19 DIAGNOSIS — M546 Pain in thoracic spine: Secondary | ICD-10-CM | POA: Diagnosis not present

## 2021-01-19 DIAGNOSIS — M9902 Segmental and somatic dysfunction of thoracic region: Secondary | ICD-10-CM | POA: Diagnosis not present

## 2021-01-21 DIAGNOSIS — M25562 Pain in left knee: Secondary | ICD-10-CM | POA: Diagnosis not present

## 2021-01-28 DIAGNOSIS — M542 Cervicalgia: Secondary | ICD-10-CM | POA: Diagnosis not present

## 2021-01-28 DIAGNOSIS — M25562 Pain in left knee: Secondary | ICD-10-CM | POA: Diagnosis not present

## 2021-01-28 DIAGNOSIS — M9902 Segmental and somatic dysfunction of thoracic region: Secondary | ICD-10-CM | POA: Diagnosis not present

## 2021-01-28 DIAGNOSIS — M546 Pain in thoracic spine: Secondary | ICD-10-CM | POA: Diagnosis not present

## 2021-01-28 DIAGNOSIS — M9901 Segmental and somatic dysfunction of cervical region: Secondary | ICD-10-CM | POA: Diagnosis not present

## 2021-01-30 DIAGNOSIS — M25562 Pain in left knee: Secondary | ICD-10-CM | POA: Diagnosis not present

## 2021-02-02 DIAGNOSIS — M25562 Pain in left knee: Secondary | ICD-10-CM | POA: Diagnosis not present

## 2021-02-04 DIAGNOSIS — M9902 Segmental and somatic dysfunction of thoracic region: Secondary | ICD-10-CM | POA: Diagnosis not present

## 2021-02-04 DIAGNOSIS — M546 Pain in thoracic spine: Secondary | ICD-10-CM | POA: Diagnosis not present

## 2021-02-04 DIAGNOSIS — M542 Cervicalgia: Secondary | ICD-10-CM | POA: Diagnosis not present

## 2021-02-04 DIAGNOSIS — M9901 Segmental and somatic dysfunction of cervical region: Secondary | ICD-10-CM | POA: Diagnosis not present

## 2021-02-05 DIAGNOSIS — H524 Presbyopia: Secondary | ICD-10-CM | POA: Diagnosis not present

## 2021-02-05 DIAGNOSIS — Z961 Presence of intraocular lens: Secondary | ICD-10-CM | POA: Diagnosis not present

## 2021-02-05 DIAGNOSIS — E119 Type 2 diabetes mellitus without complications: Secondary | ICD-10-CM | POA: Diagnosis not present

## 2021-02-05 DIAGNOSIS — Z7984 Long term (current) use of oral hypoglycemic drugs: Secondary | ICD-10-CM | POA: Diagnosis not present

## 2021-02-05 DIAGNOSIS — H40013 Open angle with borderline findings, low risk, bilateral: Secondary | ICD-10-CM | POA: Diagnosis not present

## 2021-02-05 DIAGNOSIS — H52203 Unspecified astigmatism, bilateral: Secondary | ICD-10-CM | POA: Diagnosis not present

## 2021-02-11 DIAGNOSIS — Z96652 Presence of left artificial knee joint: Secondary | ICD-10-CM | POA: Diagnosis not present

## 2021-02-11 DIAGNOSIS — Z471 Aftercare following joint replacement surgery: Secondary | ICD-10-CM | POA: Diagnosis not present

## 2021-02-18 DIAGNOSIS — M546 Pain in thoracic spine: Secondary | ICD-10-CM | POA: Diagnosis not present

## 2021-02-18 DIAGNOSIS — M9902 Segmental and somatic dysfunction of thoracic region: Secondary | ICD-10-CM | POA: Diagnosis not present

## 2021-02-18 DIAGNOSIS — M542 Cervicalgia: Secondary | ICD-10-CM | POA: Diagnosis not present

## 2021-02-18 DIAGNOSIS — M9901 Segmental and somatic dysfunction of cervical region: Secondary | ICD-10-CM | POA: Diagnosis not present

## 2021-02-27 ENCOUNTER — Ambulatory Visit (HOSPITAL_COMMUNITY)
Admission: EM | Admit: 2021-02-27 | Discharge: 2021-02-27 | Disposition: A | Discharge status: Home / Self Care | Payer: PPO | Attending: Urgent Care | Admitting: Urgent Care

## 2021-02-27 ENCOUNTER — Telehealth (HOSPITAL_COMMUNITY): Payer: Self-pay | Admitting: Urgent Care

## 2021-02-27 ENCOUNTER — Encounter (HOSPITAL_COMMUNITY): Payer: Self-pay

## 2021-02-27 ENCOUNTER — Other Ambulatory Visit: Payer: Self-pay

## 2021-02-27 ENCOUNTER — Telehealth (HOSPITAL_COMMUNITY): Payer: Self-pay | Admitting: Emergency Medicine

## 2021-02-27 DIAGNOSIS — M25551 Pain in right hip: Secondary | ICD-10-CM

## 2021-02-27 DIAGNOSIS — E118 Type 2 diabetes mellitus with unspecified complications: Secondary | ICD-10-CM | POA: Diagnosis not present

## 2021-02-27 DIAGNOSIS — I10 Essential (primary) hypertension: Secondary | ICD-10-CM | POA: Diagnosis not present

## 2021-02-27 DIAGNOSIS — M25559 Pain in unspecified hip: Secondary | ICD-10-CM

## 2021-02-27 DIAGNOSIS — E119 Type 2 diabetes mellitus without complications: Secondary | ICD-10-CM

## 2021-02-27 DIAGNOSIS — S7001XA Contusion of right hip, initial encounter: Secondary | ICD-10-CM

## 2021-02-27 DIAGNOSIS — T148XXA Other injury of unspecified body region, initial encounter: Secondary | ICD-10-CM | POA: Diagnosis not present

## 2021-02-27 MED ORDER — DICLOFENAC SODIUM 1 % EX GEL: 2.0000 g | Freq: Four times a day (QID) | CUTANEOUS | 0 refills | Status: AC

## 2021-02-27 MED ORDER — PREGABALIN 25 MG PO CAPS: 25.0000 mg | ORAL_CAPSULE | Freq: Two times a day (BID) | ORAL | 0 refills | Status: DC

## 2021-02-27 MED ORDER — DICLOFENAC SODIUM 3 % EX GEL: 2.0000 g | Freq: Two times a day (BID) | CUTANEOUS | 0 refills | Status: DC | PRN

## 2021-02-27 NOTE — ED Provider Notes (Signed)
[place]   MRN: [id number] DOB: November 09, 1950  Subjective:   Christina Jensen is a 70 y.o. female presenting for several day history of acute onset right hip pain. She accidentally bumped against the right corner of the kitchen drawer and developed a large swollen area.  She is noted some bruising.  However, now she is having pain that randomly and intermittently radiates from the swelling across the lateral portion of her thigh down to her knee.  In general, if she does not bump the area or does not put a lot of pressure over the swollen areas she does fine without any pain.  However, if she happens to irritated some it will cause severe pain.  Has not taken any medications for relief.  She is a diabetic, is not on insulin.  She also has a history of thyroid disease, hypertension and hyperlipidemia.  No history of CAD, heart disease.  Would prefer to avoid NSAIDs.  She also has elevated liver enzymes as seen on chart review.  No history of kidney disease, confirmed through labs from her primary care provider.  No current facility-administered medications for this encounter.  Current Outpatient Medications:    acetaminophen (TYLENOL) 500 MG tablet, Take 2 tablets (1,000 mg total) by mouth every 8 (eight) hours as needed., Disp: , Rfl: 2   B Complex-C (SUPER B COMPLEX PO), Take 1 tablet by mouth every evening., Disp: , Rfl:    cholecalciferol (VITAMIN D) 1000 UNITS tablet, Take 1,000 Units by mouth every evening. , Disp: , Rfl:    clonazePAM (KLONOPIN) 1 MG tablet, Take 1 mg by mouth at bedtime., Disp: , Rfl:    docusate sodium (COLACE) 100 MG capsule, Take 1 capsule (100 mg total) by mouth 2 (two) times daily., Disp: , Rfl:    glipiZIDE (GLUCOTROL) 5 MG tablet, Take 5 mg by mouth 2 (two) times daily before a meal., Disp: , Rfl:    levothyroxine (SYNTHROID) 112 MCG tablet, Take 112 mcg by mouth daily before breakfast., Disp: , Rfl:    losartan (COZAAR) 50 MG tablet, Take 50 mg by  mouth daily., Disp: , Rfl:    Magnesium 250 MG TABS, Take 500 mg by mouth daily., Disp: , Rfl:    methocarbamol (ROBAXIN) 500 MG tablet, Take 1 tablet (500 mg total) by mouth every 6 (six) hours as needed for muscle spasms., Disp: 40 tablet, Rfl: 0   Multiple Vitamin (MULTIVITAMIN) tablet, Take 1 tablet by mouth every morning. , Disp: , Rfl:    oxyCODONE (ROXICODONE) 5 MG immediate release tablet, Take 1-2 tablets (5-10 mg total) by mouth every 6 (six) hours as needed for severe pain., Disp: 42 tablet, Rfl: 0   polyethylene glycol (MIRALAX / GLYCOLAX) 17 g packet, Take 17 g by mouth 2 (two) times daily., Disp: 14 each, Rfl: 0   rosuvastatin (CRESTOR) 10 MG tablet, Take 10 mg by mouth daily., Disp: , Rfl:    venlafaxine XR (EFFEXOR-XR) 75 MG 24 hr capsule, Take 75 mg by mouth daily with breakfast., Disp: , Rfl:    vitamin E 180 MG (400 UNITS) capsule, Take 800 Units by mouth daily., Disp: , Rfl:     Allergies  Allergen Reactions   Simvastatin Itching and Nausea And Vomiting    Past Medical History:  Diagnosis Date   Asthma    Breast cancer ([place]) 2002   Cancer ([place]) 2002   breast,lumpectomy   Complication of anesthesia    Diabetes mellitus  Diabetes mellitus without complication ([place])    type 1   High cholesterol    Hyperlipidemia    Hypertension    Hypothyroid    Osteoarthritis    Palpitations    chronic   RBBB    chronic   Tachycardia    H/O   Thyroid disease    hypo     Past Surgical History:  Procedure Laterality Date   BACK SURGERY  1992   BREAST LUMPECTOMY Right 2002   BREAST LUMPECTOMY     2002   cataracts     both eyes   childbirth     x1   TOTAL KNEE ARTHROPLASTY Left 12/30/2020   Procedure: TOTAL KNEE ARTHROPLASTY;  Surgeon: Paralee Cancel, MD;  Location: [facility];  Service: Orthopedics;  Laterality: Left;  70 mins    Family History  Problem Relation Age of Onset   Heart disease Mother     Social History   Tobacco Use   Smoking status: Former     Types: Cigarettes    Quit date: 1980    Years since quitting: 42.5   Smokeless tobacco: Never  Vaping Use   Vaping Use: Never used  Substance Use Topics   Alcohol use: Yes    Comment: occasionally wine   Drug use: Never    ROS   Objective:   Vitals: BP 113/71 (BP Location: Left Arm)   Pulse 96   Temp 98.5 F (36.9 C) (Oral)   Resp 19   SpO2 100%   Physical Exam Constitutional:      General: She is not in acute distress.    Appearance: Normal appearance. She is well-developed. She is not ill-appearing, toxic-appearing or diaphoretic.  HENT:     Head: Normocephalic and atraumatic.     Nose: Nose normal.     Mouth/Throat:     Mouth: Mucous membranes are moist.     Pharynx: Oropharynx is clear.  Eyes:     General: No scleral icterus.    Extraocular Movements: Extraocular movements intact.     Pupils: Pupils are equal, round, and reactive to light.  Cardiovascular:     Rate and Rhythm: Normal rate.  Pulmonary:     Effort: Pulmonary effort is normal.  Musculoskeletal:       Legs:  Skin:    General: Skin is warm and dry.  Neurological:     General: No focal deficit present.     Mental Status: She is alert and oriented to person, place, and time.  Psychiatric:        Mood and Affect: Mood normal.        Behavior: Behavior normal.        Thought Content: Thought content normal.        Judgment: Judgment normal.    Assessment and Plan :   PDMP not reviewed this encounter.  1. Hip pain   2. Hematoma   3. Contusion of right hip, initial encounter   4. Type 2 diabetes mellitus treated without insulin ([place])   5. Essential hypertension     Recommended against aspiration. Will use conservative management with diclofenac gel, Lyrica. Counseled patient on potential for adverse effects with medications prescribed/recommended today, ER and return-to-clinic precautions discussed, patient verbalized understanding.    Jaynee Eagles, [place] 02/27/21 1523

## 2021-02-27 NOTE — Telephone Encounter (Signed)
This Probation officer made patient aware of a prescription change by [employer].   Patient verbalized understanding.

## 2021-02-27 NOTE — Telephone Encounter (Signed)
Diclofenac 3% is not approved and needs a prior authorization.  Sent a script for a different strength as approved by her insurance provider.

## 2021-02-27 NOTE — ED Triage Notes (Signed)
Pt presents with c/o hip pain.  States she bumped her right hip on the corner of a kitchen drawer.   States she has seen a knot get bigger and states the area is painful and is concerned the pain has caused a nerve problem.

## 2021-05-13 DIAGNOSIS — Z1231 Encounter for screening mammogram for malignant neoplasm of breast: Secondary | ICD-10-CM | POA: Diagnosis not present

## 2021-06-03 DIAGNOSIS — Z20822 Contact with and (suspected) exposure to covid-19: Secondary | ICD-10-CM | POA: Diagnosis not present

## 2021-06-24 DIAGNOSIS — I10 Essential (primary) hypertension: Secondary | ICD-10-CM | POA: Diagnosis not present

## 2021-06-24 DIAGNOSIS — F411 Generalized anxiety disorder: Secondary | ICD-10-CM | POA: Diagnosis not present

## 2021-06-24 DIAGNOSIS — Z5181 Encounter for therapeutic drug level monitoring: Secondary | ICD-10-CM | POA: Diagnosis not present

## 2021-06-24 DIAGNOSIS — Z853 Personal history of malignant neoplasm of breast: Secondary | ICD-10-CM | POA: Diagnosis not present

## 2021-06-24 DIAGNOSIS — K591 Functional diarrhea: Secondary | ICD-10-CM | POA: Diagnosis not present

## 2021-06-24 DIAGNOSIS — E1165 Type 2 diabetes mellitus with hyperglycemia: Secondary | ICD-10-CM | POA: Diagnosis not present

## 2021-06-24 DIAGNOSIS — F3342 Major depressive disorder, recurrent, in full remission: Secondary | ICD-10-CM | POA: Diagnosis not present

## 2021-06-24 DIAGNOSIS — K219 Gastro-esophageal reflux disease without esophagitis: Secondary | ICD-10-CM | POA: Diagnosis not present

## 2021-06-24 DIAGNOSIS — E782 Mixed hyperlipidemia: Secondary | ICD-10-CM | POA: Diagnosis not present

## 2021-06-24 DIAGNOSIS — Z7984 Long term (current) use of oral hypoglycemic drugs: Secondary | ICD-10-CM | POA: Diagnosis not present

## 2021-06-24 DIAGNOSIS — K7581 Nonalcoholic steatohepatitis (NASH): Secondary | ICD-10-CM | POA: Diagnosis not present

## 2021-06-24 DIAGNOSIS — Z0000 Encounter for general adult medical examination without abnormal findings: Secondary | ICD-10-CM | POA: Diagnosis not present

## 2021-06-24 DIAGNOSIS — E039 Hypothyroidism, unspecified: Secondary | ICD-10-CM | POA: Diagnosis not present

## 2021-06-30 DIAGNOSIS — R7989 Other specified abnormal findings of blood chemistry: Secondary | ICD-10-CM | POA: Diagnosis not present

## 2021-06-30 DIAGNOSIS — R946 Abnormal results of thyroid function studies: Secondary | ICD-10-CM | POA: Diagnosis not present

## 2021-06-30 DIAGNOSIS — Z5181 Encounter for therapeutic drug level monitoring: Secondary | ICD-10-CM | POA: Diagnosis not present

## 2021-07-22 DIAGNOSIS — F331 Major depressive disorder, recurrent, moderate: Secondary | ICD-10-CM | POA: Diagnosis not present

## 2021-07-22 DIAGNOSIS — E039 Hypothyroidism, unspecified: Secondary | ICD-10-CM | POA: Diagnosis not present

## 2021-07-22 DIAGNOSIS — K7581 Nonalcoholic steatohepatitis (NASH): Secondary | ICD-10-CM | POA: Diagnosis not present

## 2021-07-22 DIAGNOSIS — I10 Essential (primary) hypertension: Secondary | ICD-10-CM | POA: Diagnosis not present

## 2021-07-22 DIAGNOSIS — F411 Generalized anxiety disorder: Secondary | ICD-10-CM | POA: Diagnosis not present

## 2021-07-22 DIAGNOSIS — Z5181 Encounter for therapeutic drug level monitoring: Secondary | ICD-10-CM | POA: Diagnosis not present

## 2021-07-22 DIAGNOSIS — E1165 Type 2 diabetes mellitus with hyperglycemia: Secondary | ICD-10-CM | POA: Diagnosis not present

## 2021-07-22 DIAGNOSIS — E782 Mixed hyperlipidemia: Secondary | ICD-10-CM | POA: Diagnosis not present

## 2021-12-24 ENCOUNTER — Encounter (HOSPITAL_COMMUNITY): Payer: Self-pay | Admitting: Oncology

## 2021-12-24 ENCOUNTER — Emergency Department (HOSPITAL_COMMUNITY): Payer: PPO

## 2021-12-24 ENCOUNTER — Observation Stay (HOSPITAL_COMMUNITY)
Admission: EM | Admit: 2021-12-24 | Discharge: 2021-12-25 | Disposition: A | Discharge status: Home / Self Care | Payer: PPO | Attending: General Surgery | Admitting: General Surgery

## 2021-12-24 ENCOUNTER — Observation Stay (HOSPITAL_COMMUNITY): Payer: PPO

## 2021-12-24 ENCOUNTER — Other Ambulatory Visit: Payer: Self-pay

## 2021-12-24 DIAGNOSIS — W01198A Fall on same level from slipping, tripping and stumbling with subsequent striking against other object, initial encounter: Secondary | ICD-10-CM | POA: Diagnosis not present

## 2021-12-24 DIAGNOSIS — E039 Hypothyroidism, unspecified: Secondary | ICD-10-CM | POA: Insufficient documentation

## 2021-12-24 DIAGNOSIS — Z7984 Long term (current) use of oral hypoglycemic drugs: Secondary | ICD-10-CM | POA: Diagnosis not present

## 2021-12-24 DIAGNOSIS — I10 Essential (primary) hypertension: Secondary | ICD-10-CM | POA: Insufficient documentation

## 2021-12-24 DIAGNOSIS — Z96652 Presence of left artificial knee joint: Secondary | ICD-10-CM | POA: Insufficient documentation

## 2021-12-24 DIAGNOSIS — R109 Unspecified abdominal pain: Secondary | ICD-10-CM | POA: Diagnosis not present

## 2021-12-24 DIAGNOSIS — J45909 Unspecified asthma, uncomplicated: Secondary | ICD-10-CM | POA: Diagnosis not present

## 2021-12-24 DIAGNOSIS — E119 Type 2 diabetes mellitus without complications: Secondary | ICD-10-CM | POA: Insufficient documentation

## 2021-12-24 DIAGNOSIS — Z853 Personal history of malignant neoplasm of breast: Secondary | ICD-10-CM | POA: Diagnosis not present

## 2021-12-24 DIAGNOSIS — S2242XA Multiple fractures of ribs, left side, initial encounter for closed fracture: Secondary | ICD-10-CM | POA: Diagnosis not present

## 2021-12-24 DIAGNOSIS — Z87891 Personal history of nicotine dependence: Secondary | ICD-10-CM | POA: Insufficient documentation

## 2021-12-24 DIAGNOSIS — S299XXA Unspecified injury of thorax, initial encounter: Secondary | ICD-10-CM | POA: Diagnosis present

## 2021-12-24 LAB — BASIC METABOLIC PANEL
Anion gap: 6 (ref 5–15)
BUN: 15 mg/dL (ref 8–23)
CO2: 30 mmol/L (ref 22–32)
Calcium: 10 mg/dL (ref 8.9–10.3)
Chloride: 102 mmol/L (ref 98–111)
Creatinine, Ser: 0.67 mg/dL (ref 0.44–1.00)
GFR, Estimated: >60 mL/min (ref >60)
Glucose, Bld: 170 mg/dL — ABNORMAL HIGH (ref 70–99)
Potassium: 3.5 mmol/L (ref 3.5–5.1)
Sodium: 138 mmol/L (ref 135–145)

## 2021-12-24 LAB — CBC
HCT: 41.8 % (ref 36.0–46.0)
Hemoglobin: 14 g/dL (ref 12.0–15.0)
MCH: 33.9 pg (ref 26.0–34.0)
MCHC: 33.5 g/dL (ref 30.0–36.0)
MCV: 101.2 fL — ABNORMAL HIGH (ref 80.0–100.0)
Platelets: 168 10*3/uL (ref 150–400)
RBC: 4.13 MIL/uL (ref 3.87–5.11)
RDW: 14 % (ref 11.5–15.5)
WBC: 9.7 10*3/uL (ref 4.0–10.5)
nRBC: 0 % (ref 0.0–0.2)

## 2021-12-24 LAB — GLUCOSE, CAPILLARY: Glucose-Capillary: 134 mg/dL — ABNORMAL HIGH (ref 70–99)

## 2021-12-24 LAB — CBG MONITORING, ED: Glucose-Capillary: 149 mg/dL — ABNORMAL HIGH (ref 70–99)

## 2021-12-24 MED ORDER — INSULIN ASPART 100 UNIT/ML IJ SOLN
0.0000 [IU] | Freq: Every day | INTRAMUSCULAR | Status: DC
  Filled 2021-12-24: qty 0.05

## 2021-12-24 MED ORDER — ONDANSETRON HCL 4 MG/2ML IJ SOLN: 4.0000 mg | Freq: Four times a day (QID) | INTRAMUSCULAR | Status: DC | PRN

## 2021-12-24 MED ORDER — SODIUM CHLORIDE 0.9 % IV SOLN: 250.0000 mL | INTRAVENOUS | Status: DC | PRN

## 2021-12-24 MED ORDER — ENOXAPARIN SODIUM 30 MG/0.3ML IJ SOSY
30.0000 mg | PREFILLED_SYRINGE | Freq: Two times a day (BID) | INTRAMUSCULAR | Status: DC
  Administered 2021-12-25: 30 mg via SUBCUTANEOUS
  Filled 2021-12-24: qty 0.3

## 2021-12-24 MED ORDER — ACETAMINOPHEN 325 MG PO TABS
650.0000 mg | ORAL_TABLET | Freq: Four times a day (QID) | ORAL | Status: DC
  Administered 2021-12-24 – 2021-12-25 (×5): 650 mg via ORAL
  Filled 2021-12-24 (×5): qty 2

## 2021-12-24 MED ORDER — LIDOCAINE 5 % EX PTCH
1.0000 | MEDICATED_PATCH | CUTANEOUS | Status: DC
  Administered 2021-12-24 – 2021-12-25 (×2): 1 via TRANSDERMAL
  Filled 2021-12-24 (×2): qty 1

## 2021-12-24 MED ORDER — PANTOPRAZOLE SODIUM 40 MG IV SOLR: 40.0000 mg | Freq: Every day | INTRAVENOUS | Status: DC

## 2021-12-24 MED ORDER — PANTOPRAZOLE SODIUM 40 MG PO TBEC
40.0000 mg | DELAYED_RELEASE_TABLET | Freq: Every day | ORAL | Status: DC
  Administered 2021-12-24 – 2021-12-25 (×2): 40 mg via ORAL
  Filled 2021-12-24 (×2): qty 1

## 2021-12-24 MED ORDER — HYDROMORPHONE HCL 1 MG/ML IJ SOLN
0.5000 mg | Freq: Once | INTRAMUSCULAR | Status: AC
  Administered 2021-12-24: 0.5 mg via INTRAVENOUS
  Filled 2021-12-24: qty 1

## 2021-12-24 MED ORDER — HYDROMORPHONE HCL 1 MG/ML IJ SOLN
1.0000 mg | INTRAMUSCULAR | Status: DC | PRN
  Administered 2021-12-24: 1 mg via INTRAVENOUS
  Filled 2021-12-24: qty 1

## 2021-12-24 MED ORDER — LEVOTHYROXINE SODIUM 25 MCG PO TABS
125.0000 ug | ORAL_TABLET | Freq: Every day | ORAL | Status: DC
  Administered 2021-12-25: 125 ug via ORAL
  Filled 2021-12-24: qty 1

## 2021-12-24 MED ORDER — ONE-DAILY MULTI VITAMINS PO TABS: 1.0000 | ORAL_TABLET | Freq: Every morning | ORAL | Status: DC

## 2021-12-24 MED ORDER — IOHEXOL 300 MG/ML  SOLN
100.0000 mL | Freq: Once | INTRAMUSCULAR | Status: AC | PRN
  Administered 2021-12-24: 100 mL via INTRAVENOUS

## 2021-12-24 MED ORDER — POLYETHYLENE GLYCOL 3350 17 G PO PACK: 17.0000 g | PACK | Freq: Every day | ORAL | Status: DC | PRN

## 2021-12-24 MED ORDER — BUPROPION HCL ER (SR) 150 MG PO TB12
150.0000 mg | ORAL_TABLET | Freq: Every day | ORAL | Status: DC
  Administered 2021-12-25: 150 mg via ORAL
  Filled 2021-12-24: qty 1

## 2021-12-24 MED ORDER — METHOCARBAMOL 500 MG PO TABS
500.0000 mg | ORAL_TABLET | Freq: Four times a day (QID) | ORAL | Status: DC
  Administered 2021-12-24 – 2021-12-25 (×5): 500 mg via ORAL
  Filled 2021-12-24 (×5): qty 1

## 2021-12-24 MED ORDER — SODIUM CHLORIDE 0.9% FLUSH
3.0000 mL | Freq: Two times a day (BID) | INTRAVENOUS | Status: DC
  Administered 2021-12-24 – 2021-12-25 (×3): 3 mL via INTRAVENOUS

## 2021-12-24 MED ORDER — VENLAFAXINE HCL ER 75 MG PO CP24
75.0000 mg | ORAL_CAPSULE | Freq: Every day | ORAL | Status: DC
  Administered 2021-12-24: 75 mg via ORAL
  Filled 2021-12-24 (×2): qty 1

## 2021-12-24 MED ORDER — HYDRALAZINE HCL 20 MG/ML IJ SOLN: 10.0000 mg | INTRAMUSCULAR | Status: DC | PRN

## 2021-12-24 MED ORDER — SODIUM CHLORIDE 0.9% FLUSH: 3.0000 mL | INTRAVENOUS | Status: DC | PRN

## 2021-12-24 MED ORDER — DOCUSATE SODIUM 100 MG PO CAPS
100.0000 mg | ORAL_CAPSULE | Freq: Two times a day (BID) | ORAL | Status: DC
  Administered 2021-12-24 – 2021-12-25 (×3): 100 mg via ORAL
  Filled 2021-12-24 (×3): qty 1

## 2021-12-24 MED ORDER — CLONAZEPAM 0.5 MG PO TABS
1.0000 mg | ORAL_TABLET | Freq: Every day | ORAL | Status: DC
Start: 2021-12-24 — End: 2021-12-26
  Administered 2021-12-24: 1 mg via ORAL
  Filled 2021-12-24: qty 2

## 2021-12-24 MED ORDER — ADULT MULTIVITAMIN W/MINERALS CH
1.0000 | ORAL_TABLET | Freq: Every day | ORAL | Status: DC
Start: 2021-12-24 — End: 2021-12-26
  Administered 2021-12-24 – 2021-12-25 (×2): 1 via ORAL
  Filled 2021-12-24 (×2): qty 1

## 2021-12-24 MED ORDER — SODIUM CHLORIDE (PF) 0.9 % IJ SOLN
INTRAMUSCULAR | Status: AC
  Administered 2021-12-24: 3 mL
  Filled 2021-12-24: qty 50

## 2021-12-24 MED ORDER — OXYCODONE HCL 5 MG PO TABS
5.0000 mg | ORAL_TABLET | ORAL | Status: DC | PRN
  Administered 2021-12-24 – 2021-12-25 (×3): 10 mg via ORAL
  Filled 2021-12-24 (×3): qty 2

## 2021-12-24 MED ORDER — BISACODYL 10 MG RE SUPP: 10.0000 mg | Freq: Every day | RECTAL | Status: DC | PRN

## 2021-12-24 MED ORDER — KETOROLAC TROMETHAMINE 15 MG/ML IJ SOLN
15.0000 mg | Freq: Once | INTRAMUSCULAR | Status: AC
  Administered 2021-12-24: 15 mg via INTRAVENOUS
  Filled 2021-12-24: qty 1

## 2021-12-24 MED ORDER — LOSARTAN POTASSIUM 50 MG PO TABS
50.0000 mg | ORAL_TABLET | Freq: Every day | ORAL | Status: DC
Start: 2021-12-24 — End: 2021-12-26
  Administered 2021-12-24 – 2021-12-25 (×2): 50 mg via ORAL
  Filled 2021-12-24: qty 1
  Filled 2021-12-24: qty 2

## 2021-12-24 MED ORDER — INSULIN ASPART 100 UNIT/ML IJ SOLN
0.0000 [IU] | Freq: Three times a day (TID) | INTRAMUSCULAR | Status: DC
  Administered 2021-12-24 – 2021-12-25 (×4): 2 [IU] via SUBCUTANEOUS
  Filled 2021-12-24: qty 0.15

## 2021-12-24 MED ORDER — ONDANSETRON 4 MG PO TBDP: 4.0000 mg | ORAL_TABLET | Freq: Four times a day (QID) | ORAL | Status: DC | PRN

## 2021-12-24 NOTE — ED Notes (Signed)
[facility] has been called. Pt is on transpo list

## 2021-12-24 NOTE — ED Provider Notes (Signed)
[facility] DEPT Provider Note   CSN: [id number] Arrival date & time: 12/24/21  8676     History  Chief Complaint  Patient presents with   Rib Injury    Christina Jensen is a 71 y.o. female.  HPI Patient presents with left rib and flank pain.  Fell yesterday and hit her left ribs against something metal.  Pain worse with movements.  Worse with deep breaths.  Not feeling difficulty breathing but states it does hurt to breathe.  Also will have spasms in the area.  No lightheadedness or dizziness.  Not on blood thinners.  Has taken two 5 mg oxycodones that she had at home and has not relieved the pain.  Although states she was able to sleep rather well last night.   Past Medical History:  Diagnosis Date   Asthma    Breast cancer ([place]) 2002   Cancer ([place]) 2002   breast,lumpectomy   Complication of anesthesia    Diabetes mellitus    Diabetes mellitus without complication (HCC)    type 1   High cholesterol    Hyperlipidemia    Hypertension    Hypothyroid    Osteoarthritis    Palpitations    chronic   RBBB    chronic   Tachycardia    H/O   Thyroid disease    hypo    Home Medications Prior to Admission medications   Medication Sig Start Date End Date Taking? Authorizing Provider  acetaminophen (TYLENOL) 500 MG tablet Take 2 tablets (1,000 mg total) by mouth every 8 (eight) hours as needed. 12/30/20   Irving Copas, PA-C  B Complex-C (SUPER B COMPLEX PO) Take 1 tablet by mouth every evening.    [provider]  cholecalciferol (VITAMIN D) 1000 UNITS tablet Take 1,000 Units by mouth every evening.     [provider]  clonazePAM (KLONOPIN) 1 MG tablet Take 1 mg by mouth at bedtime. 08/29/18   [provider]  diclofenac Sodium (VOLTAREN) 1 % GEL Apply 2 g topically 4 (four) times daily. 02/27/21   Jaynee Eagles, PA-C  Diclofenac Sodium 3 % GEL Apply 2 g topically 2 (two) times daily as needed. 02/27/21   Jaynee Eagles, PA-C   docusate sodium (COLACE) 100 MG capsule Take 1 capsule (100 mg total) by mouth 2 (two) times daily. 12/30/20   Irving Copas, PA-C  glipiZIDE (GLUCOTROL) 5 MG tablet Take 5 mg by mouth 2 (two) times daily before a meal. 10/26/18   [provider]  levothyroxine (SYNTHROID) 112 MCG tablet Take 112 mcg by mouth daily before breakfast. 09/03/18   [provider]  losartan (COZAAR) 50 MG tablet Take 50 mg by mouth daily.    [provider]  Magnesium 250 MG TABS Take 500 mg by mouth daily.    [provider]  methocarbamol (ROBAXIN) 500 MG tablet Take 1 tablet (500 mg total) by mouth every 6 (six) hours as needed for muscle spasms. 12/30/20   Irving Copas, PA-C  Multiple Vitamin (MULTIVITAMIN) tablet Take 1 tablet by mouth every morning.     [provider]  oxyCODONE (ROXICODONE) 5 MG immediate release tablet Take 1-2 tablets (5-10 mg total) by mouth every 6 (six) hours as needed for severe pain. 12/30/20   Irving Copas, PA-C  polyethylene glycol (MIRALAX / GLYCOLAX) 17 g packet Take 17 g by mouth 2 (two) times daily. 12/30/20   Irving Copas, PA-C  pregabalin ([place])  25 MG capsule Take 1 capsule (25 mg total) by mouth 2 (two) times daily. 02/27/21   Jaynee Eagles, PA-C  rosuvastatin (CRESTOR) 10 MG tablet Take 10 mg by mouth daily. 09/03/18   [provider]  venlafaxine XR (EFFEXOR-XR) 75 MG 24 hr capsule Take 75 mg by mouth daily with breakfast.    [provider]  vitamin E 180 MG (400 UNITS) capsule Take 800 Units by mouth daily.    [provider]      Allergies    Simvastatin    Review of Systems   Review of Systems  Constitutional:  Negative for appetite change.  Respiratory:  Negative for shortness of breath.   Cardiovascular:  Positive for chest pain.  Gastrointestinal:  Negative for abdominal pain.   Physical Exam Updated Vital Signs BP 129/68   Pulse 95   Temp 97.6 F (36.4 C) (Oral)   Resp 16    Ht '5\' 7"'$  (1.702 m)   Wt 79.4 kg   SpO2 91%   BMI 27.41 kg/m  Physical Exam Vitals and nursing note reviewed.  Pulmonary:     Comments: Tenderness along with some ecchymosis on left posterior lateral ribs/flank.  No subcu emphysema.  Pain worse with movements. Chest:     Chest wall: Tenderness present.  Abdominal:     Tenderness: There is abdominal tenderness.     Comments: Mild left upper quadrant tenderness without rebound or guarding.  No hernia palpated.  Skin:    Capillary Refill: Capillary refill takes less than 2 seconds.  Neurological:     Mental Status: She is alert.    ED Results / Procedures / Treatments   Labs (all labs ordered are listed, but only abnormal results are displayed) Labs Reviewed  BASIC METABOLIC PANEL - Abnormal; Notable for the following components:      Result Value   Glucose, Bld 170 (*)    All other components within normal limits  CBC - Abnormal; Notable for the following components:   MCV 101.2 (*)    All other components within normal limits  HIV ANTIBODY (ROUTINE TESTING W REFLEX)    EKG None  Radiology CT CHEST ABDOMEN PELVIS W CONTRAST  Result Date: 12/24/2021 CLINICAL DATA:  Poly trauma. Blunt trauma. Mechanical fall. LEFT-sided rib pain. EXAM: CT CHEST, ABDOMEN, AND PELVIS WITH CONTRAST TECHNIQUE: Multidetector CT imaging of the chest, abdomen and pelvis was performed following the standard protocol during bolus administration of intravenous contrast. RADIATION DOSE REDUCTION: This exam was performed according to the departmental dose-optimization program which includes automated exposure control, adjustment of the mA and/or kV according to patient size and/or use of iterative reconstruction technique. CONTRAST:  17m OMNIPAQUE IOHEXOL 300 MG/ML  SOLN COMPARISON:  None Available. FINDINGS: CT CHEST FINDINGS Cardiovascular: No evidence thoracic aortic injury. No pericardial fluid. No mediastinal hematoma. Mediastinum/Nodes: Trachea and  esophagus are normal. Lungs/Pleura: There is atelectasis at the LEFT lung base. No pneumothorax. No pleural fluid. The mild RIGHT base atelectasis additionally. Musculoskeletal: There are 4 mildly displaced LEFT rib fractures. Fracture of the eighth rib posteriorly (image 37/2). Fracture of the ninth rib posteriorly (image 44/2). Fracture of the posterolateral eleventh rib on the LEFT. Additionally fracture of the lateral eighth rib (image 45/2). CT ABDOMEN AND PELVIS FINDINGS Hepatobiliary: No hepatic laceration. Small hypodense lesion along the margin of the anterior LEFT hepatic lobe is favored small benign cysts. (8 mm on image 56/2 ). Pancreas: Pancreas is normal. No ductal dilatation. No pancreatic inflammation. Spleen:  No splenic laceration.  No perisplenic fluid. Adrenals/urinary tract: Adrenal glands and kidneys are normal. The ureters and bladder normal. Stomach/Bowel: Stomach, small bowel, appendix, and cecum are normal. Multiple diverticula of the descending colon and sigmoid colon without acute inflammation. Vascular/Lymphatic: Abdominal aorta is normal caliber. There is no retroperitoneal or periportal lymphadenopathy. No pelvic lymphadenopathy. Reproductive: Post hysterectomy.  Adnexa unremarkable Other: No mesenteric fluid.  No free fluid. Musculoskeletal: No pelvic fracture spine fracture. IMPRESSION: Chest Impression: 1. Four mildly displaced posterior LEFT rib fractures. 2. LEFT basilar atelectasis.  No pneumothorax or pleural fluid 3. Mild RIGHT basilar atelectasis. 4. No aortic injury. Abdomen / Pelvis Impression: 1. No evidence of solid organ injury in the abdomen pelvis. 2. No pelvic fracture spine fracture Electronically Signed   By: Suzy Bouchard M.D.   On: 12/24/2021 10:16    Procedures Procedures    Medications Ordered in ED Medications  sodium chloride (PF) 0.9 % injection (has no administration in time range)  enoxaparin (LOVENOX) injection 30 mg (has no administration in  time range)  sodium chloride flush (NS) 0.9 % injection 3 mL (has no administration in time range)  sodium chloride flush (NS) 0.9 % injection 3 mL (has no administration in time range)  0.9 %  sodium chloride infusion (has no administration in time range)  hydrALAZINE (APRESOLINE) injection 10 mg (has no administration in time range)  pantoprazole (PROTONIX) EC tablet 40 mg (has no administration in time range)    Or  pantoprazole (PROTONIX) injection 40 mg (has no administration in time range)  ondansetron (ZOFRAN-ODT) disintegrating tablet 4 mg (has no administration in time range)    Or  ondansetron (ZOFRAN) injection 4 mg (has no administration in time range)  docusate sodium (COLACE) capsule 100 mg (has no administration in time range)  bisacodyl (DULCOLAX) suppository 10 mg (has no administration in time range)  polyethylene glycol (MIRALAX / GLYCOLAX) packet 17 g (has no administration in time range)  HYDROmorphone (DILAUDID) injection 1 mg (has no administration in time range)  oxyCODONE (Oxy IR/ROXICODONE) immediate release tablet 5-10 mg (has no administration in time range)  acetaminophen (TYLENOL) tablet 650 mg (has no administration in time range)  methocarbamol (ROBAXIN) tablet 500 mg (has no administration in time range)  lidocaine (LIDODERM) 5 % 1 patch (has no administration in time range)  ketorolac (TORADOL) 15 MG/ML injection 15 mg (15 mg Intravenous Given 12/24/21 0831)  iohexol (OMNIPAQUE) 300 MG/ML solution 100 mL (100 mLs Intravenous Contrast Given 12/24/21 0946)  HYDROmorphone (DILAUDID) injection 0.5 mg (0.5 mg Intravenous Given 12/24/21 1051)    ED Course/ Medical Decision Making/ A&P                           Medical Decision Making Amount and/or Complexity of Data Reviewed Labs: ordered. Radiology: ordered.  Risk Prescription drug management. Decision regarding hospitalization.   Patient with fall yesterday.  Left flank/rib pain.  Some bruising.  CT  scan done and showed multiple rib fractures.  Rib 8 has 2 fractures and also 9 and 11 have 1 fracture.  Pain somewhat improved with Toradol although not really able to move around.  Patient slightly controlled last night with 10 mg of oxycodone but still states that it let her sleep but really could not move.  With continued pain will discuss with surgery for possible admission.  Discussed with Dr. Georgette Dover who recommended talking to the trauma doctor at [facility].  Discussed with Dr.  Grandville Silos, who stated he will admit the patient.  No pneumothorax seen.  Will require IV pain control.          Final Clinical Impression(s) / ED Diagnoses Final diagnoses:  Closed fracture of multiple ribs of left side, initial encounter    Rx / DC Orders ED Discharge Orders     None         Davonna Belling, MD 12/24/21 1205

## 2021-12-24 NOTE — H&P (Addendum)
Trauma Admission Note  Christina Jensen [birth year]  [id number].    Requesting MD: Davonna Belling, MD Chief Complaint/Reason for Consult: L rib fractures HPI:  Patient is a 71 year old female who presented to [facility] s/p fall yesterday with left sided chest pain. Patient was working on her shed when she slipped in some soot and fell, striking her left back on a trailer. Denies hitting her head or LOC. Only complaining of pain in her posterior left ribs. Worse with movement and deep inspiration. Also having muscle spasms. She tried taking two 5 mg tablets of oxycodone that she had at home without relief. She denies SOB, lightheadedness, dizziness, abdominal pain, nausea or vomiting. Pain was worse this morning and she had difficulty getting out of bed so she came to the ED.  PMH otherwise significant for HTN, HLD, T2DM, Hx of breast cancer s/p lumpectomy in 2002, Hypothyroidism. She is not on any blood thinning medications. She reports occasional alcohol use (1-2 glasses of wine every day or every other day), denies illicit drug use, former smoker (quit in 1980). Lives at home with her husband.   ROS: Review of Systems  Constitutional: Negative.   Respiratory:  Negative for shortness of breath.        Chest wall pain  Cardiovascular: Negative.   Gastrointestinal: Negative.   Musculoskeletal:  Positive for falls. Negative for back pain and neck pain.   Family History  Problem Relation Age of Onset   Heart disease Mother     Past Medical History:  Diagnosis Date   Asthma    Breast cancer ([place]) 2002   Cancer ([place]) 2002   breast,lumpectomy   Complication of anesthesia    Diabetes mellitus    Diabetes mellitus without complication ([place])    type 1   High cholesterol    Hyperlipidemia    Hypertension    Hypothyroid    Osteoarthritis    Palpitations    chronic   RBBB    chronic   Tachycardia    H/O   Thyroid disease    hypo    Past Surgical History:  Procedure  Laterality Date   BACK SURGERY  1992   BREAST LUMPECTOMY Right 2002   BREAST LUMPECTOMY     2002   cataracts     both eyes   childbirth     x1   TOTAL KNEE ARTHROPLASTY Left 12/30/2020   Procedure: TOTAL KNEE ARTHROPLASTY;  Surgeon: Paralee Cancel, MD;  Location: [facility] ORS;  Service: Orthopedics;  Laterality: Left;  70 mins    Social History:  reports that she quit smoking about 43 years ago. She has never used smokeless tobacco. She reports current alcohol use. She reports that she does not use drugs.  Allergies:  Allergies  Allergen Reactions   Simvastatin Itching and Nausea And Vomiting    (Not in a hospital admission)   Blood pressure 129/68, pulse 95, temperature 97.6 F (36.4 C), temperature source Oral, resp. rate 16, height '5\' 7"'$  (1.702 m), weight 79.4 kg, SpO2 91 %. Physical Exam:  General: pleasant, WD, WN female who is laying in bed in NAD HEENT: head is normocephalic, atraumatic.  Sclera are noninjected.  Ears and nose without any masses or lesions.  Mouth is pink and moist. Neck supple and nontender Heart: regular, rate, and rhythm.  Normal s1,s2. No obvious murmurs, gallops, or rubs noted.  Palpable radial and pedal pulses bilaterally Lungs: CTAB, no wheezes, rhonchi, or rales noted.  Respiratory effort nonlabored. Bruising and tenderness to the posterior left chest Abd: soft, NT, ND, +BS, no masses, hernias, or organomegaly MS: all 4 extremities are symmetrical with no cyanosis, clubbing, or edema. Skin: warm and dry with no masses, lesions, or rashes Neuro: MAEs Psych: A&Ox3 with an appropriate affect.   Results for orders placed or performed during the hospital encounter of 12/24/21 (from the past 48 hour(s))  Basic metabolic panel     Status: Abnormal   Collection Time: 12/24/21  7:40 AM  Result Value Ref Range   Sodium 138 135 - 145 mmol/L   Potassium 3.5 3.5 - 5.1 mmol/L   Chloride 102 98 - 111 mmol/L   CO2 30 22 - 32 mmol/L   Glucose, Bld 170 (H) 70 - 99  mg/dL    Comment: Glucose reference range applies only to samples taken after fasting for at least 8 hours.   BUN 15 8 - 23 mg/dL   Creatinine, Ser 0.67 0.44 - 1.00 mg/dL   Calcium 10.0 8.9 - 10.3 mg/dL   GFR, Estimated >60 >60 mL/min    Comment: (NOTE) Calculated using the CKD-EPI Creatinine Equation (2021)    Anion gap 6 5 - 15    Comment: Performed at [facility], [place] [address] [place], [place] [address]  CBC     Status: Abnormal   Collection Time: 12/24/21  7:40 AM  Result Value Ref Range   WBC 9.7 4.0 - 10.5 K/uL   RBC 4.13 3.87 - 5.11 MIL/uL   Hemoglobin 14.0 12.0 - 15.0 g/dL   HCT 41.8 36.0 - 46.0 %   MCV 101.2 (H) 80.0 - 100.0 fL   MCH 33.9 26.0 - 34.0 pg   MCHC 33.5 30.0 - 36.0 g/dL   RDW 14.0 11.5 - 15.5 %   Platelets 168 150 - 400 K/uL   nRBC 0.0 0.0 - 0.2 %    Comment: Performed at [facility], [place] [address] [place], [place] [address]   CT CHEST ABDOMEN PELVIS W CONTRAST  Result Date: 12/24/2021 CLINICAL DATA:  Poly trauma. Blunt trauma. Mechanical fall. LEFT-sided rib pain. EXAM: CT CHEST, ABDOMEN, AND PELVIS WITH CONTRAST TECHNIQUE: Multidetector CT imaging of the chest, abdomen and pelvis was performed following the standard protocol during bolus administration of intravenous contrast. RADIATION DOSE REDUCTION: This exam was performed according to the departmental dose-optimization program which includes automated exposure control, adjustment of the mA and/or kV according to patient size and/or use of iterative reconstruction technique. CONTRAST:  158m OMNIPAQUE IOHEXOL 300 MG/ML  SOLN COMPARISON:  None Available. FINDINGS: CT CHEST FINDINGS Cardiovascular: No evidence thoracic aortic injury. No pericardial fluid. No mediastinal hematoma. Mediastinum/Nodes: Trachea and esophagus are normal. Lungs/Pleura: There is atelectasis at the LEFT lung base. No pneumothorax. No pleural fluid. The mild RIGHT base atelectasis additionally.  Musculoskeletal: There are 4 mildly displaced LEFT rib fractures. Fracture of the eighth rib posteriorly (image 37/2). Fracture of the ninth rib posteriorly (image 44/2). Fracture of the posterolateral eleventh rib on the LEFT. Additionally fracture of the lateral eighth rib (image 45/2). CT ABDOMEN AND PELVIS FINDINGS Hepatobiliary: No hepatic laceration. Small hypodense lesion along the margin of the anterior LEFT hepatic lobe is favored small benign cysts. (8 mm on image 56/2 ). Pancreas: Pancreas is normal. No ductal dilatation. No pancreatic inflammation. Spleen: No splenic laceration.  No perisplenic fluid. Adrenals/urinary tract: Adrenal glands and kidneys are normal. The ureters and bladder normal. Stomach/Bowel: Stomach, small bowel, appendix, and  cecum are normal. Multiple diverticula of the descending colon and sigmoid colon without acute inflammation. Vascular/Lymphatic: Abdominal aorta is normal caliber. There is no retroperitoneal or periportal lymphadenopathy. No pelvic lymphadenopathy. Reproductive: Post hysterectomy.  Adnexa unremarkable Other: No mesenteric fluid.  No free fluid. Musculoskeletal: No pelvic fracture spine fracture. IMPRESSION: Chest Impression: 1. Four mildly displaced posterior LEFT rib fractures. 2. LEFT basilar atelectasis.  No pneumothorax or pleural fluid 3. Mild RIGHT basilar atelectasis. 4. No aortic injury. Abdomen / Pelvis Impression: 1. No evidence of solid organ injury in the abdomen pelvis. 2. No pelvic fracture spine fracture Electronically Signed   By: Suzy Bouchard M.D.   On: 12/24/2021 10:16      Assessment/Plan Fall L 8-9, 11 rib fxs - multimodal pain control, IS, pulm toilet. Repeat CXR in AM HTN - home meds HLD  T2DM - SSI Hx of breast cancer s/p lumpectomy in 2002 Hypothyroidism - home med  FEN: CM diet, SLIV VTE: LMWH ID: no current abx  Dispo: Admit to [facility] for observation on the trauma service. Pain control. PT/OT    Margie Billet,  [facility] Surgery 12/24/2021, 1:35 PM Please see Amion for pager number during day hours 7:00am-4:30pm

## 2021-12-24 NOTE — ED Triage Notes (Signed)
Pt had a mechanical fall yesterday landing on her left side against metal. Pt c/o left rib cage pain. Endorses increase in pain w/ deep breath.

## 2021-12-25 ENCOUNTER — Observation Stay (HOSPITAL_COMMUNITY): Payer: PPO

## 2021-12-25 LAB — CBC
HCT: 41.9 % (ref 36.0–46.0)
Hemoglobin: 13.8 g/dL (ref 12.0–15.0)
MCH: 33.3 pg (ref 26.0–34.0)
MCHC: 32.9 g/dL (ref 30.0–36.0)
MCV: 101.2 fL — ABNORMAL HIGH (ref 80.0–100.0)
Platelets: 132 10*3/uL — ABNORMAL LOW (ref 150–400)
RBC: 4.14 MIL/uL (ref 3.87–5.11)
RDW: 13.9 % (ref 11.5–15.5)
WBC: 10.3 10*3/uL (ref 4.0–10.5)
nRBC: 0 % (ref 0.0–0.2)

## 2021-12-25 LAB — BASIC METABOLIC PANEL
Anion gap: 9 (ref 5–15)
BUN: 17 mg/dL (ref 8–23)
CO2: 27 mmol/L (ref 22–32)
Calcium: 10.2 mg/dL (ref 8.9–10.3)
Chloride: 102 mmol/L (ref 98–111)
Creatinine, Ser: 0.76 mg/dL (ref 0.44–1.00)
GFR, Estimated: >60 mL/min (ref >60)
Glucose, Bld: 130 mg/dL — ABNORMAL HIGH (ref 70–99)
Potassium: 3.5 mmol/L (ref 3.5–5.1)
Sodium: 138 mmol/L (ref 135–145)

## 2021-12-25 LAB — GLUCOSE, CAPILLARY
Glucose-Capillary: 140 mg/dL — ABNORMAL HIGH (ref 70–99)
Glucose-Capillary: 130 mg/dL — ABNORMAL HIGH (ref 70–99)
Glucose-Capillary: 131 mg/dL — ABNORMAL HIGH (ref 70–99)

## 2021-12-25 LAB — HEMOGLOBIN A1C
Hgb A1c MFr Bld: 6 % — ABNORMAL HIGH (ref 4.8–5.6)
Mean Plasma Glucose: 125.5 mg/dL

## 2021-12-25 LAB — HIV ANTIBODY (ROUTINE TESTING W REFLEX): HIV Screen 4th Generation wRfx: NONREACTIVE

## 2021-12-25 MED ORDER — OXYCODONE HCL 10 MG PO TABS: 5.0000 mg | ORAL_TABLET | Freq: Four times a day (QID) | ORAL | 0 refills | Status: AC | PRN

## 2021-12-25 MED ORDER — POLYETHYLENE GLYCOL 3350 17 G PO PACK: 17.0000 g | PACK | Freq: Every day | ORAL | 0 refills | Status: AC | PRN

## 2021-12-25 MED ORDER — LIDOCAINE 5 % EX PTCH: 1.0000 | MEDICATED_PATCH | CUTANEOUS | 0 refills | Status: AC

## 2021-12-25 MED ORDER — METHOCARBAMOL 500 MG PO TABS
500.0000 mg | ORAL_TABLET | Freq: Four times a day (QID) | ORAL | 0 refills | Status: AC | PRN
Start: 2021-12-25 — End: ?

## 2021-12-25 NOTE — TOC Initial Note (Signed)
Transition of Care [facility]) - Initial/Assessment Note    Patient Details  Name: Christina Jensen MRN: [id number] Date of Birth: Jul 16, 1951  Transition of [facility] CM/SW Contact:    Ella Bodo, RN Phone Number: 12/25/2021, 2:14 PM  Clinical Narrative:                 Pt is 71 yr old F admitted on 12/24/21 s/p fall. Imaging (+) for L 8-11 rib fx. Prior to admission, patient independent and living at home with spouse, who can provide assistance at discharge.  PT recommending no outpatient follow-up or DME.  No discharge needs anticipated.  Notified patient's nurse that husband cannot remember where he parked his car, and may need security to assist with that.  Expected Discharge Plan: Home/Self Care Barriers to Discharge: Continued Medical Work up   Patient Goals and CMS Choice Patient states their goals for this hospitalization and ongoing recovery are:: to feel better      Expected Discharge Plan and Services Expected Discharge Plan: Home/Self Care   Discharge Planning Services: CM Consult   Living arrangements for the past 2 months: Single Family Home                                      Prior Living Arrangements/Services Living arrangements for the past 2 months: Single Family Home Lives with:: Spouse Patient language and need for interpreter reviewed:: Yes Do you feel safe going back to the place where you live?: Yes      Need for Family Participation in Patient Care: Yes (Comment) Care giver support system in place?: Yes (comment)   Criminal Activity/Legal Involvement Pertinent to Current Situation/Hospitalization: No - Comment as needed  Activities of Daily Living Home Assistive Devices/Equipment: Eyeglasses ADL Screening (condition at time of admission) Patient's cognitive ability adequate to safely complete daily activities?: Yes Is the patient deaf or have difficulty hearing?: No Does the patient have difficulty seeing, even when wearing  glasses/contacts?: No Does the patient have difficulty concentrating, remembering, or making decisions?: No Patient able to express need for assistance with ADLs?: Yes Does the patient have difficulty dressing or bathing?: No Independently performs ADLs?: Yes (appropriate for developmental age) Does the patient have difficulty walking or climbing stairs?: No Weakness of Legs: None Weakness of Arms/Hands: None                   Emotional Assessment Appearance:: Appears stated age   Affect (typically observed): Accepting Orientation: : Oriented to Self, Oriented to Place, Oriented to  Time, Oriented to Situation      Admission diagnosis:  Closed fracture of multiple ribs of left side, initial encounter [S22.42XA] Multiple fractures of ribs, left side, initial encounter for closed fracture [S22.42XA] Patient Active Problem List   Diagnosis Date Noted   Multiple fractures of ribs, left side, initial encounter for closed fracture 12/24/2021   S/P total knee arthroplasty, left 12/30/2020   Cancer ([place])    RBBB    Tachycardia    Palpitations    Osteoarthritis    PCP:  Nickola Major, MD Pharmacy:   [facility] [id number], [place] [address] Phone: [phone] Fax: [phone]     Social Determinants of Health (SDOH) Interventions    Readmission Risk Interventions     View : No data to display.  Reinaldo Raddle, RN, BSN  Trauma/Neuro ICU Case Manager [phone]

## 2021-12-25 NOTE — Evaluation (Signed)
Physical Therapy Evaluation Patient Details Name: EPIFANIA LITTRELL MRN: [id number] DOB: 1950-11-08 Today's Date: 12/25/2021  History of Present Illness  Pt is 71 yr old F admitted on 12/24/21 s/p fall. Imaging (+) for L 8-11 rib fx. PMH: asthma, breast CA, DM, HTN, hypothyroid, osteoarthritis, RBBB, tachycardia  Clinical Impression  Pt was previously independent with ADLs and functional mobility prior to fall resulting in L rib fx.  Pt is currently able to transfer with independence, but requires supervision with gait due to minor balance deficits.  Pt would benefit from skilled PT in acute care to address deficits indicated in initial eval and practice stair negotiation as pt reports not feeling up for trialing stairs today.  Pt will be safe to return home on D/C with assist from spouse as needed.       Recommendations for follow up therapy are one component of a multi-disciplinary discharge planning process, led by the attending physician.  Recommendations may be updated based on patient status, additional functional criteria and insurance authorization.  Follow Up Recommendations No PT follow up    Assistance Recommended at Discharge Intermittent Supervision/Assistance  Patient can return home with the following  A little help with walking and/or transfers;A little help with bathing/dressing/bathroom;Assistance with cooking/housework;Assist for transportation;Help with stairs or ramp for entrance    Equipment Recommendations    Recommendations for Other Services       Functional Status Assessment Patient has had a recent decline in their functional status and demonstrates the ability to make significant improvements in function in a reasonable and predictable amount of time.     Precautions / Restrictions Precautions Precautions: None      Mobility  Bed Mobility                 Patient Response: Cooperative  Transfers Overall transfer level: Independent Equipment used:  None               General transfer comment: Able to stand with independence.  Transfers into chair with independence.    Ambulation/Gait Ambulation/Gait assistance: Supervision Gait Distance (Feet): 200 Feet Assistive device: None         General Gait Details: Amb in halls with supervision only.  Demos slight postural sway/staggering, but no LOB with activity.  Amb all the way to the stair case but then states she does not feel up for trying to negotiate 3 steps today due to rib pain.  However, pt does demo sufficient strength and balance to manage stairs and household steps should not be an issue.  On return to room requests to stop in bathroom to change gown and wash face.  Req's min A to change gown, independence with face washing/brushing teeth.  Stairs            Wheelchair Mobility    Modified Rankin (Stroke Patients Only)       Balance Overall balance assessment: Mild deficits observed, not formally tested                                           Pertinent Vitals/Pain Pain Assessment Pain Assessment: 0-10 Pain Score: 8  Pain Location: L ribs Pain Descriptors / Indicators: Aching, Discomfort, Throbbing Pain Intervention(s): Limited activity within patient's tolerance, Monitored during session, Repositioned    Home Living Family/patient expects to be discharged to:: Private residence Living Arrangements: Spouse/significant other Available Help  at Discharge: Family Type of Home: House Home Access: Stairs to enter Entrance Stairs-Rails: Can reach both Entrance Stairs-Number of Steps: 3 Alternate Level Stairs-Number of Steps: Flight - but can stay downstairs Home Layout: Two level Home Equipment: None      Prior Function Prior Level of Function : Independent/Modified Independent                     Hand Dominance        Extremity/Trunk Assessment   Upper Extremity Assessment Upper Extremity Assessment: Defer to OT  evaluation    Lower Extremity Assessment Lower Extremity Assessment: Overall WFL for tasks assessed    Cervical / Trunk Assessment Cervical / Trunk Assessment: Normal  Communication   Communication: No difficulties  Cognition Arousal/Alertness: Awake/alert Behavior During Therapy: WFL for tasks assessed/performed Overall Cognitive Status: Within Functional Limits for tasks assessed                                 General Comments: Pt is sitting up eating breakfast when PT arrives.  Alert and oriented x 3.  Agreeable to work with PT.  States ribs have been painful but she's been able to move around.        General Comments General comments (skin integrity, edema, etc.): Educated on use of pillow for support when coughing, sneezing, laughing etc to help support ribs and dec pain.    Exercises     Assessment/Plan    PT Assessment Patient needs continued PT services  PT Problem List Decreased balance;Decreased activity tolerance;Pain;Decreased mobility       PT Treatment Interventions Gait training;Balance training;Therapeutic exercise;Stair training;Functional mobility training;Patient/family education;Therapeutic activities    PT Goals (Current goals can be found in the Care Plan section)  Acute Rehab PT Goals Patient Stated Goal: Pt's goal is to decrease pain. PT Goal Formulation: With patient Time For Goal Achievement: 12/25/21 Potential to Achieve Goals: Good    Frequency Min 5X/week     Co-evaluation               AM-PAC PT "6 Clicks" Mobility  Outcome Measure Help needed turning from your back to your side while in a flat bed without using bedrails?: A Little Help needed moving from lying on your back to sitting on the side of a flat bed without using bedrails?: A Little Help needed moving to and from a bed to a chair (including a wheelchair)?: None Help needed standing up from a chair using your arms (e.g., wheelchair or bedside chair)?:  None Help needed to walk in hospital room?: A Little Help needed climbing 3-5 steps with a railing? : A Little 6 Click Score: 20    End of Session Equipment Utilized During Treatment: Gait belt Activity Tolerance: Patient tolerated treatment well Patient left: in chair;with call bell/phone within reach Nurse Communication: Mobility status PT Visit Diagnosis: Unsteadiness on feet (R26.81);Pain Pain - Right/Left: Left    Time: 5038-8828 PT Time Calculation (min) (ACUTE ONLY): 21 min   Charges:   PT Evaluation $PT Eval Low Complexity: 1 Low          Oslo Huntsman A. Coley Kulikowski, PT, DPT Acute Rehabilitation Services Office: [place] 12/25/2021, 9:41 AM

## 2021-12-25 NOTE — TOC CAGE-AID Note (Signed)
Transition of Care [facility]) - CAGE-AID Screening   Patient Details  Name: Christina Jensen MRN: [id number] Date of Birth: 05-09-1951  Transition of Care [facility]) CM/SW Contact:    Gaetano Hawthorne Tarpley-Carter, LCSWA Phone Number: 12/25/2021, 2:15 PM   Clinical Narrative: Pt participated in [place].  Pt stated she does not use substance or ETOH.  Pt was not offered resources, due to no usage of substance or ETOH.    Lamount Bankson Tarpley-Carter, MSW, LCSW-A Pronouns:  She/Her/Hers [facility]itions of [facility] Clinical Social Worker Direct Number:  [phone] [email or web address]  CAGE-AID Screening:    Have You Ever Felt You Ought to Cut Down on Your Drinking or Drug Use?: No Have People Annoyed You By [employer] Your Drinking Or Drug Use?: No Have You Felt Bad Or Guilty About Your Drinking Or Drug Use?: No Have You Ever Had a Drink or Used Drugs First Thing In The Morning to Steady Your Nerves or to Get Rid of a Hangover?: No CAGE-AID Score: 0  Substance Abuse Education Offered: No

## 2021-12-25 NOTE — Progress Notes (Signed)
Patient ID: Christina Jensen, female   DOB: Feb 06, 1951, 71 y.o.   MRN: [id number] [facility] Surgery Progress Note     Subjective: CC-  Continues to have a lot of pain from rib fractures. Unsure if pain medication is really helping. Denies SOB. Has not gotten OOB much.  Tolerating diet.  Objective: Vital signs in last 24 hours: Temp:  [97.6 F (36.4 C)-98.6 F (37 C)] 98.3 F (36.8 C) (05/19 0800) Pulse Rate:  [87-101] 101 (05/19 0800) Resp:  [16-18] 16 (05/19 0800) BP: (107-143)/(64-82) 143/82 (05/19 0800) SpO2:  [91 %-100 %] 100 % (05/19 0800) Weight:  [82.4 kg] 82.4 kg (05/18 2158) Last BM Date : 12/22/21 (per pt report)  Intake/Output from previous day: No intake/output data recorded. Intake/Output this shift: No intake/output data recorded.  PE: Gen:  Alert, NAD, pleasant Card:  RRR Pulm:  CTAB, no W/R/R, rate and effort normal on room air, pulling 600 on IS Abd: Soft, NT/ND, +BS  Lab Results:  Recent Labs    12/24/21 0740 12/25/21 0306  WBC 9.7 10.3  HGB 14.0 13.8  HCT 41.8 41.9  PLT 168 132*   BMET Recent Labs    12/24/21 0740 12/25/21 0306  NA 138 138  K 3.5 3.5  CL 102 102  CO2 30 27  GLUCOSE 170* 130*  BUN 15 17  CREATININE 0.67 0.76  CALCIUM 10.0 10.2   PT/INR No results for input(s): LABPROT, INR in the last 72 hours. CMP     Component Value Date/Time   NA 138 12/25/2021 0306   K 3.5 12/25/2021 0306   CL 102 12/25/2021 0306   CO2 27 12/25/2021 0306   GLUCOSE 130 (H) 12/25/2021 0306   BUN 17 12/25/2021 0306   CREATININE 0.76 12/25/2021 0306   CALCIUM 10.2 12/25/2021 0306   PROT 7.4 04/21/2013 0400   ALBUMIN 4.4 04/21/2013 0400   AST 42 (H) 04/21/2013 0400   ALT 46 (H) 04/21/2013 0400   ALKPHOS 61 04/21/2013 0400   BILITOT 0.5 04/21/2013 0400   GFRNONAA >60 12/25/2021 0306   GFRAA 75 (L) 04/21/2013 0400   Lipase     Component Value Date/Time   LIPASE 45 04/21/2013 0400       Studies/Results: CT CHEST ABDOMEN PELVIS W  CONTRAST  Result Date: 12/24/2021 CLINICAL DATA:  Poly trauma. Blunt trauma. Mechanical fall. LEFT-sided rib pain. EXAM: CT CHEST, ABDOMEN, AND PELVIS WITH CONTRAST TECHNIQUE: Multidetector CT imaging of the chest, abdomen and pelvis was performed following the standard protocol during bolus administration of intravenous contrast. RADIATION DOSE REDUCTION: This exam was performed according to the departmental dose-optimization program which includes automated exposure control, adjustment of the mA and/or kV according to patient size and/or use of iterative reconstruction technique. CONTRAST:  140m OMNIPAQUE IOHEXOL 300 MG/ML  SOLN COMPARISON:  None Available. FINDINGS: CT CHEST FINDINGS Cardiovascular: No evidence thoracic aortic injury. No pericardial fluid. No mediastinal hematoma. Mediastinum/Nodes: Trachea and esophagus are normal. Lungs/Pleura: There is atelectasis at the LEFT lung base. No pneumothorax. No pleural fluid. The mild RIGHT base atelectasis additionally. Musculoskeletal: There are 4 mildly displaced LEFT rib fractures. Fracture of the eighth rib posteriorly (image 37/2). Fracture of the ninth rib posteriorly (image 44/2). Fracture of the posterolateral eleventh rib on the LEFT. Additionally fracture of the lateral eighth rib (image 45/2). CT ABDOMEN AND PELVIS FINDINGS Hepatobiliary: No hepatic laceration. Small hypodense lesion along the margin of the anterior LEFT hepatic lobe is favored small benign cysts. (8 mm on image  56/2 ). Pancreas: Pancreas is normal. No ductal dilatation. No pancreatic inflammation. Spleen: No splenic laceration.  No perisplenic fluid. Adrenals/urinary tract: Adrenal glands and kidneys are normal. The ureters and bladder normal. Stomach/Bowel: Stomach, small bowel, appendix, and cecum are normal. Multiple diverticula of the descending colon and sigmoid colon without acute inflammation. Vascular/Lymphatic: Abdominal aorta is normal caliber. There is no retroperitoneal  or periportal lymphadenopathy. No pelvic lymphadenopathy. Reproductive: Post hysterectomy.  Adnexa unremarkable Other: No mesenteric fluid.  No free fluid. Musculoskeletal: No pelvic fracture spine fracture. IMPRESSION: Chest Impression: 1. Four mildly displaced posterior LEFT rib fractures. 2. LEFT basilar atelectasis.  No pneumothorax or pleural fluid 3. Mild RIGHT basilar atelectasis. 4. No aortic injury. Abdomen / Pelvis Impression: 1. No evidence of solid organ injury in the abdomen pelvis. 2. No pelvic fracture spine fracture Electronically Signed   By: Suzy Bouchard M.D.   On: 12/24/2021 10:16   DG Chest Port 1 View  Result Date: 12/25/2021 CLINICAL DATA:  Left rib fractures. EXAM: PORTABLE CHEST 1 VIEW COMPARISON:  Chest CT 12/24/2021 9:47 a.m. FINDINGS: The cardiomediastinal silhouette is unchanged. The vascular pattern is normal. There is a low inspiration with streaky atelectasis or contusion versus infiltrate again in the left base, minimal left pleural fluid or hemorrhage. There are mildly displaced fractures of the lateral aspects of the left seventh through ninth ribs. There are displaced fractures of the posterior left tenth and eleventh ribs. No new skeletal abnormality is seen. Reverse S shaped thoracic scoliosis again shown. There is no pneumothorax.  No pneumomediastinum is seen. IMPRESSION: Multiple left ribcage fractures, with streaky atelectasis or infiltrate versus contusion in the left base and small left pleural effusion or hemorrhage. No pneumothorax is visible. Electronically Signed   By: Telford Nab M.D.   On: 12/25/2021 07:27    Anti-infectives: Anti-infectives (From admission, onward)    None        Assessment/Plan Fall L 8-9, 11 rib fxs - multimodal pain control, IS, pulm toilet. Repeat CXR 5/19 stable without PNX HTN - home meds HLD  T2DM - A1c 6. SSI Hx of breast cancer s/p lumpectomy in 2002 Hypothyroidism - home med   FEN: CM diet, SLIV VTE:  LMWH ID: no current abx   Dispo: Increase tylenol and robaxin, encouraged pt to take oxy more frequently. PT/OT.  I reviewed last 24 h vitals and pain scores, last 48 h intake and output, last 24 h labs and trends (CBC, BMP), and last 24 h imaging results (CXR)    LOS: 0 days    Wellington Hampshire, [facility] Surgery 12/25/2021, 9:00 AM Please see Amion for pager number during day hours 7:00am-4:30pm

## 2021-12-25 NOTE — Care Management Obs Status (Signed)
[place] NOTIFICATION   Patient Details  Name: Christina Jensen MRN: [id number] Date of Birth: 09-Oct-1950   Medicare Observation Status Notification Given:  Yes    Ella Bodo, RN 12/25/2021, 1:00 PM

## 2021-12-25 NOTE — Discharge Summary (Signed)
Christina Jensen Discharge Summary   Patient ID: CINDIA HUSTEAD MRN: [id number] DOB/AGE: 70-Sep-1952 70 y.o.  Admit date: 12/24/2021 Discharge date: 12/25/2021   Discharge Diagnosis Fall L 8-9, 11 rib fxs HTN HLD  T2DM Hx of breast cancer s/p lumpectomy in 2002 Hypothyroidism  Consultants None  Imaging: CT CHEST ABDOMEN PELVIS W CONTRAST  Result Date: 12/24/2021 CLINICAL DATA:  Poly trauma. Blunt trauma. Mechanical fall. LEFT-sided rib pain. EXAM: CT CHEST, ABDOMEN, AND PELVIS WITH CONTRAST TECHNIQUE: Multidetector CT imaging of the chest, abdomen and pelvis was performed following the standard protocol during bolus administration of intravenous contrast. RADIATION DOSE REDUCTION: This exam was performed according to the departmental dose-optimization program which includes automated exposure control, adjustment of the mA and/or kV according to patient size and/or use of iterative reconstruction technique. CONTRAST:  148m OMNIPAQUE IOHEXOL 300 MG/ML  SOLN COMPARISON:  None Available. FINDINGS: CT CHEST FINDINGS Cardiovascular: No evidence thoracic aortic injury. No pericardial fluid. No mediastinal hematoma. Mediastinum/Nodes: Trachea and esophagus are normal. Lungs/Pleura: There is atelectasis at the LEFT lung base. No pneumothorax. No pleural fluid. The mild RIGHT base atelectasis additionally. Musculoskeletal: There are 4 mildly displaced LEFT rib fractures. Fracture of the eighth rib posteriorly (image 37/2). Fracture of the ninth rib posteriorly (image 44/2). Fracture of the posterolateral eleventh rib on the LEFT. Additionally fracture of the lateral eighth rib (image 45/2). CT ABDOMEN AND PELVIS FINDINGS Hepatobiliary: No hepatic laceration. Small hypodense lesion along the margin of the anterior LEFT hepatic lobe is favored small benign cysts. (8 mm on image 56/2 ). Pancreas: Pancreas is normal. No ductal dilatation. No pancreatic inflammation. Spleen: No splenic laceration.   No perisplenic fluid. Adrenals/urinary tract: Adrenal glands and kidneys are normal. The ureters and bladder normal. Stomach/Bowel: Stomach, small bowel, appendix, and cecum are normal. Multiple diverticula of the descending colon and sigmoid colon without acute inflammation. Vascular/Lymphatic: Abdominal aorta is normal caliber. There is no retroperitoneal or periportal lymphadenopathy. No pelvic lymphadenopathy. Reproductive: Post hysterectomy.  Adnexa unremarkable Other: No mesenteric fluid.  No free fluid. Musculoskeletal: No pelvic fracture spine fracture. IMPRESSION: Chest Impression: 1. Four mildly displaced posterior LEFT rib fractures. 2. LEFT basilar atelectasis.  No pneumothorax or pleural fluid 3. Mild RIGHT basilar atelectasis. 4. No aortic injury. Abdomen / Pelvis Impression: 1. No evidence of solid organ injury in the abdomen pelvis. 2. No pelvic fracture spine fracture Electronically Signed   By: SSuzy BouchardM.D.   On: 12/24/2021 10:16   DG Chest Port 1 View  Result Date: 12/25/2021 CLINICAL DATA:  Left rib fractures. EXAM: PORTABLE CHEST 1 VIEW COMPARISON:  Chest CT 12/24/2021 9:47 a.m. FINDINGS: The cardiomediastinal silhouette is unchanged. The vascular pattern is normal. There is a low inspiration with streaky atelectasis or contusion versus infiltrate again in the left base, minimal left pleural fluid or hemorrhage. There are mildly displaced fractures of the lateral aspects of the left seventh through ninth ribs. There are displaced fractures of the posterior left tenth and eleventh ribs. No new skeletal abnormality is seen. Reverse S shaped thoracic scoliosis again shown. There is no pneumothorax.  No pneumomediastinum is seen. IMPRESSION: Multiple left ribcage fractures, with streaky atelectasis or infiltrate versus contusion in the left base and small left pleural effusion or hemorrhage. No pneumothorax is visible. Electronically Signed   By: KTelford NabM.D.   On: 12/25/2021  07:27    Procedures None  Hospital Course:  Christina DUCHEMINis a 89yofemale who presented to the ED 5/18  s/p fall yesterday with left sided chest pain. Patient was working on her shed when she slipped in some soot and fell, striking her left back on a trailer. Denies hitting her head or LOC. Only complaining of pain in her posterior left ribs. Worse with movement and deep inspiration. Also having muscle spasms. She tried taking two 5 mg tablets of oxycodone that she had at home without relief. She denies SOB, lightheadedness, dizziness, abdominal pain, nausea or vomiting. Pain was worse this morning and she had difficulty getting out of bed so she came to the ED. Work up revealed multiple Left rib fractures (8-9, 11). Patient was admitted for pain control and pulmonary toilet. Repeat chest xray the following morning stable without pneumothorax. Patient worked with therapies during this admission. On 5/19 the patient was felt stable for discharge home.  Patient will follow up as below and knows to call with questions or concerns.    I have personally reviewed the patients medication history on the [place] controlled substance database.    Allergies as of 12/25/2021       Reactions   Simvastatin Itching, Nausea And Vomiting        Medication List     TAKE these medications    acetaminophen 500 MG tablet Commonly known as: TYLENOL Take 2 tablets (1,000 mg total) by mouth every 8 (eight) hours as needed.   buPROPion 150 MG 12 hr tablet Commonly known as: WELLBUTRIN SR Take 150 mg by mouth daily.   cholecalciferol 1000 units tablet Commonly known as: VITAMIN D Take 1,000 Units by mouth every evening.   clonazePAM 1 MG tablet Commonly known as: KLONOPIN Take 1 mg by mouth at bedtime.   diclofenac Sodium 1 % Gel Commonly known as: VOLTAREN Apply 2 g topically 4 (four) times daily. What changed:  when to take this reasons to take this   Dulaglutide 0.75 MG/0.5ML Sopn Inject 0.75 mg  into the skin once a week. Tuesday   levothyroxine 125 MCG tablet Commonly known as: SYNTHROID Take 125 mcg by mouth daily before breakfast.   lidocaine 5 % Commonly known as: LIDODERM Place 1 patch onto the skin daily. Remove & Discard patch within 12 hours or as directed by MD Start taking on: Dec 26, 2021   losartan 50 MG tablet Commonly known as: COZAAR Take 50 mg by mouth daily.   Magnesium 250 MG Tabs Take 500 mg by mouth daily.   methocarbamol 500 MG tablet Commonly known as: ROBAXIN Take 1 tablet (500 mg total) by mouth every 6 (six) hours as needed for muscle spasms.   multivitamin tablet Take 1 tablet by mouth every morning.   Oxycodone HCl 10 MG Tabs Take 0.5-1 tablets (5-10 mg total) by mouth every 6 (six) hours as needed for severe pain or moderate pain.   pioglitazone 30 MG tablet Commonly known as: ACTOS Take 30 mg by mouth daily.   polyethylene glycol 17 g packet Commonly known as: MIRALAX / GLYCOLAX Take 17 g by mouth daily as needed for moderate constipation.   rosuvastatin 20 MG tablet Commonly known as: CRESTOR Take 20 mg by mouth daily.   SUPER B COMPLEX PO Take 1 tablet by mouth every evening.   venlafaxine XR 75 MG 24 hr capsule Commonly known as: EFFEXOR-XR Take 75 mg by mouth daily with breakfast.   vitamin C 500 MG tablet Commonly known as: ASCORBIC ACID Take 500 mg by mouth daily.   vitamin E 180 MG (400 UNITS) capsule  Take 400 Units by mouth 2 (two) times daily.          Follow-up Information     Nickola Major, MD. Call.   Specialty: Family Medicine Why: Call for post-hospitalization follow up appointment regarding rib fractures Contact information: [address] [place] [address] [phone]                  Signed: Wellington Hampshire, [place] Jensen 12/25/2021, 6:08 PM Please see Amion for pager number during day hours 7:00am-4:30pm

## 2021-12-25 NOTE — Plan of Care (Signed)

## 2021-12-31 ENCOUNTER — Ambulatory Visit
Admission: RE | Admit: 2021-12-31 | Discharge: 2021-12-31 | Disposition: A | Discharge status: Home / Self Care | Payer: PPO | Source: Ambulatory Visit | Attending: Family Medicine | Admitting: Family Medicine

## 2021-12-31 ENCOUNTER — Other Ambulatory Visit: Payer: Self-pay | Admitting: Family Medicine

## 2021-12-31 DIAGNOSIS — S2242XA Multiple fractures of ribs, left side, initial encounter for closed fracture: Secondary | ICD-10-CM

## 2022-01-21 ENCOUNTER — Ambulatory Visit
Admission: RE | Admit: 2022-01-21 | Discharge: 2022-01-21 | Disposition: A | Discharge status: Home / Self Care | Payer: PPO | Source: Ambulatory Visit | Attending: Family Medicine | Admitting: Family Medicine

## 2022-01-21 ENCOUNTER — Other Ambulatory Visit: Payer: Self-pay | Admitting: Family Medicine

## 2022-01-21 DIAGNOSIS — S2242XA Multiple fractures of ribs, left side, initial encounter for closed fracture: Secondary | ICD-10-CM

## 2023-05-13 ENCOUNTER — Other Ambulatory Visit: Payer: Self-pay | Admitting: Gastroenterology

## 2023-05-13 DIAGNOSIS — K7460 Unspecified cirrhosis of liver: Secondary | ICD-10-CM

## 2023-05-25 ENCOUNTER — Ambulatory Visit
Admission: RE | Admit: 2023-05-25 | Discharge: 2023-05-25 | Disposition: A | Discharge status: Home / Self Care | Payer: PPO | Source: Ambulatory Visit | Attending: Gastroenterology | Admitting: Gastroenterology

## 2023-05-25 DIAGNOSIS — K7460 Unspecified cirrhosis of liver: Secondary | ICD-10-CM
